# Patient Record
Sex: Male | Born: 1955 | Race: White | Hispanic: No | Marital: Single | Smoking: Former smoker
Health system: Southern US, Community
[De-identification: ages and names within clinical notes are randomized; demographics above are authoritative.]

## PROBLEM LIST (undated history)

## (undated) DIAGNOSIS — F329 Major depressive disorder, single episode, unspecified: Secondary | ICD-10-CM

## (undated) DIAGNOSIS — F419 Anxiety disorder, unspecified: Secondary | ICD-10-CM

## (undated) DIAGNOSIS — I10 Essential (primary) hypertension: Secondary | ICD-10-CM

## (undated) DIAGNOSIS — H9190 Unspecified hearing loss, unspecified ear: Secondary | ICD-10-CM

## (undated) DIAGNOSIS — M1990 Unspecified osteoarthritis, unspecified site: Secondary | ICD-10-CM

## (undated) DIAGNOSIS — K219 Gastro-esophageal reflux disease without esophagitis: Secondary | ICD-10-CM

## (undated) DIAGNOSIS — I499 Cardiac arrhythmia, unspecified: Secondary | ICD-10-CM

## (undated) DIAGNOSIS — F32A Depression, unspecified: Secondary | ICD-10-CM

## (undated) HISTORY — PX: RETINAL DETACHMENT SURGERY: SHX105

## (undated) HISTORY — PX: BACK SURGERY: SHX140

## (undated) HISTORY — PX: COLONOSCOPY: SHX174

## (undated) HISTORY — PX: EYE SURGERY: SHX253

---

## 2014-07-29 HISTORY — PX: HERNIA REPAIR: SHX51
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
## 2015-02-03 ENCOUNTER — Encounter (HOSPITAL_COMMUNITY)
Admission: RE | Admit: 2015-02-03 | Discharge: 2015-02-03 | Disposition: A | Discharge status: Home / Self Care | Payer: Medicare Other | Source: Ambulatory Visit | Attending: Orthopedic Surgery | Admitting: Orthopedic Surgery

## 2015-02-03 ENCOUNTER — Encounter (HOSPITAL_COMMUNITY)
Admission: RE | Admit: 2015-02-03 | Discharge: 2015-02-03 | Disposition: A | Discharge status: Home / Self Care | Payer: Medicare Other | Source: Ambulatory Visit | Attending: Orthopaedic Surgery | Admitting: Orthopaedic Surgery

## 2015-02-03 ENCOUNTER — Encounter (HOSPITAL_COMMUNITY): Payer: Self-pay

## 2015-02-03 DIAGNOSIS — I10 Essential (primary) hypertension: Secondary | ICD-10-CM | POA: Diagnosis not present

## 2015-02-03 DIAGNOSIS — Z87891 Personal history of nicotine dependence: Secondary | ICD-10-CM | POA: Insufficient documentation

## 2015-02-03 DIAGNOSIS — Z0183 Encounter for blood typing: Secondary | ICD-10-CM | POA: Diagnosis not present

## 2015-02-03 DIAGNOSIS — M879 Osteonecrosis, unspecified: Secondary | ICD-10-CM | POA: Diagnosis not present

## 2015-02-03 DIAGNOSIS — Z01818 Encounter for other preprocedural examination: Secondary | ICD-10-CM | POA: Insufficient documentation

## 2015-02-03 DIAGNOSIS — Z01812 Encounter for preprocedural laboratory examination: Secondary | ICD-10-CM | POA: Insufficient documentation

## 2015-02-03 HISTORY — DX: Essential (primary) hypertension: I10

## 2015-02-03 HISTORY — DX: Depression, unspecified: F32.A

## 2015-02-03 HISTORY — DX: Unspecified osteoarthritis, unspecified site: M19.90

## 2015-02-03 HISTORY — DX: Anxiety disorder, unspecified: F41.9

## 2015-02-03 HISTORY — DX: Cardiac arrhythmia, unspecified: I49.9

## 2015-02-03 HISTORY — DX: Unspecified hearing loss, unspecified ear: H91.90

## 2015-02-03 HISTORY — DX: Major depressive disorder, single episode, unspecified: F32.9

## 2015-02-03 HISTORY — DX: Gastro-esophageal reflux disease without esophagitis: K21.9

## 2015-02-03 LAB — CBC WITH DIFFERENTIAL/PLATELET
Basophils Absolute: 0 10*3/uL (ref 0.0–0.1)
Basophils Relative: 0 % (ref 0–1)
Eosinophils Absolute: 0.1 10*3/uL (ref 0.0–0.7)
Eosinophils Relative: 1 % (ref 0–5)
HCT: 39.4 % (ref 39.0–52.0)
HEMOGLOBIN: 13.4 g/dL (ref 13.0–17.0)
LYMPHS ABS: 2.3 10*3/uL (ref 0.7–4.0)
Lymphocytes Relative: 26 % (ref 12–46)
MCH: 30 pg (ref 26.0–34.0)
MCHC: 34 g/dL (ref 30.0–36.0)
MCV: 88.1 fL (ref 78.0–100.0)
Monocytes Absolute: 0.8 10*3/uL (ref 0.1–1.0)
Monocytes Relative: 10 % (ref 3–12)
NEUTROS ABS: 5.5 10*3/uL (ref 1.7–7.7)
NEUTROS PCT: 63 % (ref 43–77)
Platelets: 270 10*3/uL (ref 150–400)
RBC: 4.47 MIL/uL (ref 4.22–5.81)
RDW: 14.9 % (ref 11.5–15.5)
WBC: 8.7 10*3/uL (ref 4.0–10.5)

## 2015-02-03 LAB — COMPREHENSIVE METABOLIC PANEL
ALK PHOS: 52 U/L (ref 38–126)
ALT: 34 U/L (ref 17–63)
AST: 23 U/L (ref 15–41)
Albumin: 4.2 g/dL (ref 3.5–5.0)
Anion gap: 8 (ref 5–15)
BILIRUBIN TOTAL: 0.6 mg/dL (ref 0.3–1.2)
BUN: 13 mg/dL (ref 6–20)
CHLORIDE: 101 mmol/L (ref 101–111)
CO2: 30 mmol/L (ref 22–32)
Calcium: 9.6 mg/dL (ref 8.9–10.3)
Creatinine, Ser: 0.86 mg/dL (ref 0.61–1.24)
GFR calc Af Amer: >60 mL/min (ref >60)
Glucose, Bld: 103 mg/dL — ABNORMAL HIGH (ref 65–99)
Potassium: 4.2 mmol/L (ref 3.5–5.1)
Sodium: 139 mmol/L (ref 135–145)
Total Protein: 7.7 g/dL (ref 6.5–8.1)

## 2015-02-03 LAB — PROTIME-INR
INR: 0.94 (ref 0.00–1.49)
PROTHROMBIN TIME: 12.8 s (ref 11.6–15.2)

## 2015-02-03 LAB — TYPE AND SCREEN
ABO/RH(D): B POS
Antibody Screen: NEGATIVE

## 2015-02-03 LAB — APTT: APTT: 26 s (ref 24–37)

## 2015-02-03 LAB — SURGICAL PCR SCREEN
MRSA, PCR: NEGATIVE
Staphylococcus aureus: NEGATIVE

## 2015-02-03 LAB — ABO/RH: ABO/RH(D): B POS

## 2015-02-03 NOTE — Pre-Procedure Instructions (Addendum)
    Darryl Ray  02/03/2015      Your procedure is scheduled on Tuesday, July 19.  [facility] Admitting at 5:30 A.M.   Call this number if you have problems the morning of surgery: [phone]              For any other questions, please call [phone], Monday - Friday 8 AM - 4 PM.   Remember:  Do not eat food or drink liquids after midnight Monday, July 18.  Take these medicines the morning of surgery with A SIP OF WATER : citalopram (CELEXA), omeprazole (PRILOSEC).               Take if needed: HYDROcodone-acetaminophen (NORCO).             Stop taking Vitamins, do not take any Aspirin products, Aleve (Naproxen, Advil (Ibuprofen) after Monday, July 11.   Do not wear jewelry, make-up or nail polish.  Do not wear lotions, powders, or perfumes.    Men may shave face and neck.  Do not bring valuables to the hospital.  [facility] is not responsible for any belongings or valuables.  Contacts, dentures or bridgework may not be worn into surgery.  Leave your suitcase in the car.  After surgery it may be brought to your room.  For patients admitted to the hospital, discharge time will be determined by your treatment team.  Special instructions: Review  [facility] - Preparing For Surgery.  Please read over the following fact sheets that you were given. Pain Booklet, Coughing and Deep Breathing, Blood Transfusion Information and Surgical Site Infection Prevention

## 2015-02-05 LAB — URINE CULTURE: Culture: 5000

## 2015-02-13 MED ORDER — ACETAMINOPHEN 10 MG/ML IV SOLN
1000.0000 mg | INTRAVENOUS | Status: AC
  Administered 2015-02-14: 1000 mg via INTRAVENOUS
  Filled 2015-02-13: qty 100

## 2015-02-13 MED ORDER — SODIUM CHLORIDE 0.9 % IV SOLN: 75.0000 mL/h | INTRAVENOUS | Status: DC

## 2015-02-14 ENCOUNTER — Encounter (HOSPITAL_COMMUNITY): Payer: Self-pay | Admitting: *Deleted

## 2015-02-14 ENCOUNTER — Encounter (HOSPITAL_COMMUNITY)
Admission: RE | Disposition: A | Discharge status: Skilled Nursing Facility | Payer: Self-pay | Source: Ambulatory Visit | Attending: Orthopaedic Surgery

## 2015-02-14 ENCOUNTER — Inpatient Hospital Stay (HOSPITAL_COMMUNITY)
Admission: RE | Admit: 2015-02-14 | Discharge: 2015-02-16 | DRG: 470 | Disposition: A | Discharge status: Skilled Nursing Facility | Payer: Medicare Other | Source: Ambulatory Visit | Attending: Orthopaedic Surgery | Admitting: Orthopaedic Surgery

## 2015-02-14 ENCOUNTER — Inpatient Hospital Stay (HOSPITAL_COMMUNITY): Payer: Medicare Other | Admitting: Anesthesiology

## 2015-02-14 ENCOUNTER — Inpatient Hospital Stay (HOSPITAL_COMMUNITY): Payer: Medicare Other

## 2015-02-14 DIAGNOSIS — M87051 Idiopathic aseptic necrosis of right femur: Secondary | ICD-10-CM | POA: Diagnosis present

## 2015-02-14 DIAGNOSIS — M109 Gout, unspecified: Secondary | ICD-10-CM | POA: Diagnosis present

## 2015-02-14 DIAGNOSIS — Z9889 Other specified postprocedural states: Secondary | ICD-10-CM

## 2015-02-14 DIAGNOSIS — Z981 Arthrodesis status: Secondary | ICD-10-CM | POA: Diagnosis not present

## 2015-02-14 DIAGNOSIS — D62 Acute posthemorrhagic anemia: Secondary | ICD-10-CM | POA: Diagnosis not present

## 2015-02-14 DIAGNOSIS — Z880 Allergy status to penicillin: Secondary | ICD-10-CM | POA: Diagnosis not present

## 2015-02-14 DIAGNOSIS — M87052 Idiopathic aseptic necrosis of left femur: Secondary | ICD-10-CM | POA: Diagnosis present

## 2015-02-14 DIAGNOSIS — H9190 Unspecified hearing loss, unspecified ear: Secondary | ICD-10-CM | POA: Diagnosis present

## 2015-02-14 DIAGNOSIS — M459 Ankylosing spondylitis of unspecified sites in spine: Secondary | ICD-10-CM | POA: Diagnosis present

## 2015-02-14 DIAGNOSIS — Z79891 Long term (current) use of opiate analgesic: Secondary | ICD-10-CM | POA: Diagnosis not present

## 2015-02-14 DIAGNOSIS — F329 Major depressive disorder, single episode, unspecified: Secondary | ICD-10-CM | POA: Diagnosis present

## 2015-02-14 DIAGNOSIS — K219 Gastro-esophageal reflux disease without esophagitis: Secondary | ICD-10-CM | POA: Diagnosis present

## 2015-02-14 DIAGNOSIS — M87852 Other osteonecrosis, left femur: Secondary | ICD-10-CM | POA: Diagnosis present

## 2015-02-14 DIAGNOSIS — E871 Hypo-osmolality and hyponatremia: Secondary | ICD-10-CM | POA: Diagnosis not present

## 2015-02-14 DIAGNOSIS — Z79899 Other long term (current) drug therapy: Secondary | ICD-10-CM

## 2015-02-14 DIAGNOSIS — M1611 Unilateral primary osteoarthritis, right hip: Secondary | ICD-10-CM | POA: Diagnosis present

## 2015-02-14 DIAGNOSIS — Z87891 Personal history of nicotine dependence: Secondary | ICD-10-CM

## 2015-02-14 DIAGNOSIS — F419 Anxiety disorder, unspecified: Secondary | ICD-10-CM | POA: Diagnosis present

## 2015-02-14 DIAGNOSIS — I10 Essential (primary) hypertension: Secondary | ICD-10-CM | POA: Diagnosis present

## 2015-02-14 DIAGNOSIS — M87851 Other osteonecrosis, right femur: Principal | ICD-10-CM | POA: Diagnosis present

## 2015-02-14 DIAGNOSIS — Z96649 Presence of unspecified artificial hip joint: Secondary | ICD-10-CM

## 2015-02-14 HISTORY — PX: TOTAL HIP ARTHROPLASTY: SHX124

## 2015-02-14 SURGERY — ARTHROPLASTY, HIP, TOTAL,POSTERIOR APPROACH
Anesthesia: General | Site: Hip | Laterality: Right

## 2015-02-14 MED ORDER — ROCURONIUM BROMIDE 100 MG/10ML IV SOLN
INTRAVENOUS | Status: DC | PRN
  Administered 2015-02-14: 50 mg via INTRAVENOUS

## 2015-02-14 MED ORDER — LIDOCAINE HCL (CARDIAC) 20 MG/ML IV SOLN
INTRAVENOUS | Status: DC | PRN
  Administered 2015-02-14: 20 mg via INTRAVENOUS

## 2015-02-14 MED ORDER — FERROUS SULFATE 325 (65 FE) MG PO TABS
325.0000 mg | ORAL_TABLET | Freq: Every day | ORAL | Status: DC
  Administered 2015-02-15: 325 mg via ORAL
  Filled 2015-02-14: qty 1

## 2015-02-14 MED ORDER — MIDAZOLAM HCL 2 MG/2ML IJ SOLN: 0.5000 mg | Freq: Once | INTRAMUSCULAR | Status: DC | PRN

## 2015-02-14 MED ORDER — PHENYLEPHRINE HCL 10 MG/ML IJ SOLN
INTRAMUSCULAR | Status: DC | PRN
  Administered 2015-02-14 (×3): 80 ug via INTRAVENOUS

## 2015-02-14 MED ORDER — QUINAPRIL HCL 10 MG PO TABS
40.0000 mg | ORAL_TABLET | Freq: Every morning | ORAL | Status: DC
  Administered 2015-02-15 – 2015-02-16 (×2): 40 mg via ORAL
  Filled 2015-02-14 (×2): qty 4

## 2015-02-14 MED ORDER — METOCLOPRAMIDE HCL 5 MG PO TABS: 5.0000 mg | ORAL_TABLET | Freq: Three times a day (TID) | ORAL | Status: DC | PRN

## 2015-02-14 MED ORDER — ONDANSETRON HCL 4 MG/2ML IJ SOLN
INTRAMUSCULAR | Status: DC | PRN
  Administered 2015-02-14: 4 mg via INTRAVENOUS

## 2015-02-14 MED ORDER — KETOROLAC TROMETHAMINE 15 MG/ML IJ SOLN
INTRAMUSCULAR | Status: AC
  Filled 2015-02-14: qty 1

## 2015-02-14 MED ORDER — SODIUM CHLORIDE 0.9 % IR SOLN
Status: DC | PRN
  Administered 2015-02-14: 1000 mL

## 2015-02-14 MED ORDER — FENTANYL CITRATE (PF) 250 MCG/5ML IJ SOLN
INTRAMUSCULAR | Status: AC
  Filled 2015-02-14: qty 5

## 2015-02-14 MED ORDER — HYDROMORPHONE HCL 1 MG/ML IJ SOLN
0.2500 mg | INTRAMUSCULAR | Status: DC | PRN
  Administered 2015-02-14 (×4): 0.5 mg via INTRAVENOUS

## 2015-02-14 MED ORDER — CHLORHEXIDINE GLUCONATE 4 % EX LIQD: 60.0000 mL | Freq: Once | CUTANEOUS | Status: DC

## 2015-02-14 MED ORDER — ARTIFICIAL TEARS OP OINT
TOPICAL_OINTMENT | OPHTHALMIC | Status: AC
  Filled 2015-02-14: qty 3.5

## 2015-02-14 MED ORDER — CITALOPRAM HYDROBROMIDE 40 MG PO TABS
40.0000 mg | ORAL_TABLET | Freq: Every day | ORAL | Status: DC
  Administered 2015-02-15 – 2015-02-16 (×2): 40 mg via ORAL
  Filled 2015-02-14 (×2): qty 1

## 2015-02-14 MED ORDER — ALUM & MAG HYDROXIDE-SIMETH 200-200-20 MG/5ML PO SUSP: 30.0000 mL | ORAL | Status: DC | PRN

## 2015-02-14 MED ORDER — RIVAROXABAN 10 MG PO TABS
10.0000 mg | ORAL_TABLET | Freq: Every day | ORAL | Status: DC
  Administered 2015-02-15 – 2015-02-16 (×2): 10 mg via ORAL
  Filled 2015-02-14 (×2): qty 1

## 2015-02-14 MED ORDER — BISACODYL 10 MG RE SUPP
10.0000 mg | Freq: Every day | RECTAL | Status: DC | PRN
  Administered 2015-02-15: 10 mg via RECTAL
  Filled 2015-02-14: qty 1

## 2015-02-14 MED ORDER — FENTANYL CITRATE (PF) 100 MCG/2ML IJ SOLN
INTRAMUSCULAR | Status: DC | PRN
  Administered 2015-02-14: 25 ug via INTRAVENOUS
  Administered 2015-02-14: 50 ug via INTRAVENOUS
  Administered 2015-02-14: 250 ug via INTRAVENOUS
  Administered 2015-02-14 (×2): 50 ug via INTRAVENOUS
  Administered 2015-02-14: 25 ug via INTRAVENOUS
  Administered 2015-02-14: 50 ug via INTRAVENOUS

## 2015-02-14 MED ORDER — BUPIVACAINE-EPINEPHRINE (PF) 0.25% -1:200000 IJ SOLN
INTRAMUSCULAR | Status: DC | PRN
  Administered 2015-02-14: 30 mL via PERINEURAL

## 2015-02-14 MED ORDER — KETOROLAC TROMETHAMINE 15 MG/ML IJ SOLN
INTRAMUSCULAR | Status: AC
  Administered 2015-02-14: 15 mg
  Filled 2015-02-14: qty 1

## 2015-02-14 MED ORDER — ROCURONIUM BROMIDE 50 MG/5ML IV SOLN
INTRAVENOUS | Status: AC
  Filled 2015-02-14: qty 1

## 2015-02-14 MED ORDER — KETOROLAC TROMETHAMINE 15 MG/ML IJ SOLN
15.0000 mg | Freq: Four times a day (QID) | INTRAMUSCULAR | Status: AC
  Administered 2015-02-14 – 2015-02-15 (×4): 15 mg via INTRAVENOUS
  Filled 2015-02-14 (×4): qty 1

## 2015-02-14 MED ORDER — HYDROMORPHONE HCL 1 MG/ML IJ SOLN
INTRAMUSCULAR | Status: AC
  Filled 2015-02-14: qty 1

## 2015-02-14 MED ORDER — VANCOMYCIN HCL IN DEXTROSE 1-5 GM/200ML-% IV SOLN: 1000.0000 mg | Freq: Two times a day (BID) | INTRAVENOUS | Status: DC

## 2015-02-14 MED ORDER — MENTHOL 3 MG MT LOZG: 1.0000 | LOZENGE | OROMUCOSAL | Status: DC | PRN

## 2015-02-14 MED ORDER — VANCOMYCIN HCL IN DEXTROSE 1-5 GM/200ML-% IV SOLN
INTRAVENOUS | Status: AC
  Administered 2015-02-14: 1000 mg via INTRAVENOUS
  Filled 2015-02-14: qty 200

## 2015-02-14 MED ORDER — PHENYLEPHRINE 40 MCG/ML (10ML) SYRINGE FOR IV PUSH (FOR BLOOD PRESSURE SUPPORT)
PREFILLED_SYRINGE | INTRAVENOUS | Status: AC
  Filled 2015-02-14: qty 10

## 2015-02-14 MED ORDER — METOCLOPRAMIDE HCL 5 MG/ML IJ SOLN: 5.0000 mg | Freq: Three times a day (TID) | INTRAMUSCULAR | Status: DC | PRN

## 2015-02-14 MED ORDER — SODIUM CHLORIDE 0.9 % IV SOLN
75.0000 mL/h | INTRAVENOUS | Status: DC
  Administered 2015-02-14 – 2015-02-15 (×2): 75 mL/h via INTRAVENOUS

## 2015-02-14 MED ORDER — MAGNESIUM CITRATE PO SOLN: 1.0000 | Freq: Once | ORAL | Status: AC | PRN

## 2015-02-14 MED ORDER — MEPERIDINE HCL 25 MG/ML IJ SOLN: 6.2500 mg | INTRAMUSCULAR | Status: DC | PRN

## 2015-02-14 MED ORDER — BACLOFEN 10 MG PO TABS: 10.0000 mg | ORAL_TABLET | Freq: Two times a day (BID) | ORAL | Status: DC | PRN

## 2015-02-14 MED ORDER — ALLOPURINOL 300 MG PO TABS
300.0000 mg | ORAL_TABLET | Freq: Every day | ORAL | Status: DC
  Administered 2015-02-15 – 2015-02-16 (×2): 300 mg via ORAL
  Filled 2015-02-14 (×2): qty 1

## 2015-02-14 MED ORDER — MIDAZOLAM HCL 5 MG/5ML IJ SOLN
INTRAMUSCULAR | Status: DC | PRN
  Administered 2015-02-14: 2 mg via INTRAVENOUS

## 2015-02-14 MED ORDER — LIDOCAINE HCL (CARDIAC) 20 MG/ML IV SOLN
INTRAVENOUS | Status: AC
  Filled 2015-02-14: qty 10

## 2015-02-14 MED ORDER — DEXAMETHASONE SODIUM PHOSPHATE 4 MG/ML IJ SOLN
INTRAMUSCULAR | Status: DC | PRN
  Administered 2015-02-14: 4 mg via INTRAVENOUS

## 2015-02-14 MED ORDER — HYDROMORPHONE HCL 1 MG/ML IJ SOLN
0.5000 mg | INTRAMUSCULAR | Status: DC | PRN
  Administered 2015-02-14: 1 mg via INTRAVENOUS
  Filled 2015-02-14: qty 1

## 2015-02-14 MED ORDER — DOCUSATE SODIUM 100 MG PO CAPS
100.0000 mg | ORAL_CAPSULE | Freq: Two times a day (BID) | ORAL | Status: DC
  Administered 2015-02-14 – 2015-02-16 (×4): 100 mg via ORAL
  Filled 2015-02-14 (×4): qty 1

## 2015-02-14 MED ORDER — LACTATED RINGERS IV SOLN
INTRAVENOUS | Status: DC | PRN
  Administered 2015-02-14 (×2): via INTRAVENOUS

## 2015-02-14 MED ORDER — ONDANSETRON HCL 4 MG PO TABS: 4.0000 mg | ORAL_TABLET | Freq: Four times a day (QID) | ORAL | Status: DC | PRN

## 2015-02-14 MED ORDER — PHENOL 1.4 % MT LIQD: 1.0000 | OROMUCOSAL | Status: DC | PRN

## 2015-02-14 MED ORDER — HYDROCHLOROTHIAZIDE 25 MG PO TABS
25.0000 mg | ORAL_TABLET | Freq: Every day | ORAL | Status: DC
  Administered 2015-02-15 – 2015-02-16 (×2): 25 mg via ORAL
  Filled 2015-02-14 (×2): qty 1

## 2015-02-14 MED ORDER — PHENYLEPHRINE HCL 10 MG/ML IJ SOLN
10.0000 mg | INTRAVENOUS | Status: DC | PRN
  Administered 2015-02-14: 15 ug/min via INTRAVENOUS

## 2015-02-14 MED ORDER — OXYCODONE HCL 5 MG PO TABS
5.0000 mg | ORAL_TABLET | ORAL | Status: DC | PRN
  Administered 2015-02-14: 5 mg via ORAL
  Administered 2015-02-14 – 2015-02-16 (×11): 10 mg via ORAL
  Filled 2015-02-14 (×12): qty 2

## 2015-02-14 MED ORDER — OXYCODONE HCL 5 MG PO TABS
ORAL_TABLET | ORAL | Status: AC
  Filled 2015-02-14: qty 2

## 2015-02-14 MED ORDER — ONDANSETRON HCL 4 MG/2ML IJ SOLN: 4.0000 mg | Freq: Four times a day (QID) | INTRAMUSCULAR | Status: DC | PRN

## 2015-02-14 MED ORDER — NEOSTIGMINE METHYLSULFATE 10 MG/10ML IV SOLN
INTRAVENOUS | Status: DC | PRN
  Administered 2015-02-14: 2.5 mg via INTRAVENOUS

## 2015-02-14 MED ORDER — MIDAZOLAM HCL 2 MG/2ML IJ SOLN
INTRAMUSCULAR | Status: AC
  Filled 2015-02-14: qty 2

## 2015-02-14 MED ORDER — PROPOFOL 10 MG/ML IV BOLUS
INTRAVENOUS | Status: DC | PRN
  Administered 2015-02-14: 120 mg via INTRAVENOUS

## 2015-02-14 MED ORDER — EPHEDRINE SULFATE 50 MG/ML IJ SOLN
INTRAMUSCULAR | Status: DC | PRN
  Administered 2015-02-14: 5 mg via INTRAVENOUS

## 2015-02-14 MED ORDER — PROMETHAZINE HCL 25 MG/ML IJ SOLN: 6.2500 mg | INTRAMUSCULAR | Status: DC | PRN

## 2015-02-14 MED ORDER — ACETAMINOPHEN 10 MG/ML IV SOLN
1000.0000 mg | Freq: Four times a day (QID) | INTRAVENOUS | Status: AC
  Administered 2015-02-14 – 2015-02-15 (×4): 1000 mg via INTRAVENOUS
  Filled 2015-02-14 (×4): qty 100

## 2015-02-14 MED ORDER — PRAVASTATIN SODIUM 40 MG PO TABS
80.0000 mg | ORAL_TABLET | Freq: Every day | ORAL | Status: DC
  Administered 2015-02-14 – 2015-02-15 (×2): 80 mg via ORAL
  Filled 2015-02-14 (×2): qty 2

## 2015-02-14 MED ORDER — DIPHENHYDRAMINE HCL 12.5 MG/5ML PO ELIX
12.5000 mg | ORAL_SOLUTION | ORAL | Status: DC | PRN
  Administered 2015-02-15: 25 mg via ORAL
  Filled 2015-02-14: qty 10

## 2015-02-14 MED ORDER — BUPIVACAINE-EPINEPHRINE (PF) 0.25% -1:200000 IJ SOLN
INTRAMUSCULAR | Status: AC
  Filled 2015-02-14: qty 30

## 2015-02-14 MED ORDER — GLYCOPYRROLATE 0.2 MG/ML IJ SOLN
INTRAMUSCULAR | Status: DC | PRN
  Administered 2015-02-14: 0.3 mg via INTRAVENOUS

## 2015-02-14 MED ORDER — POLYETHYLENE GLYCOL 3350 17 G PO PACK: 17.0000 g | PACK | Freq: Every day | ORAL | Status: DC | PRN

## 2015-02-14 MED ORDER — KETOROLAC TROMETHAMINE 30 MG/ML IJ SOLN
INTRAMUSCULAR | Status: AC
  Filled 2015-02-14: qty 1

## 2015-02-14 MED ORDER — DEXAMETHASONE SODIUM PHOSPHATE 4 MG/ML IJ SOLN
INTRAMUSCULAR | Status: AC
  Filled 2015-02-14: qty 1

## 2015-02-14 MED ORDER — PANTOPRAZOLE SODIUM 40 MG PO TBEC
80.0000 mg | DELAYED_RELEASE_TABLET | Freq: Every day | ORAL | Status: DC
  Administered 2015-02-15 – 2015-02-16 (×2): 80 mg via ORAL
  Filled 2015-02-14 (×2): qty 2

## 2015-02-14 SURGICAL SUPPLY — 56 items
BLADE SAW SAG 73X25 THK (BLADE) ×2
BLADE SAW SGTL 73X25 THK (BLADE) ×1 IMPLANT
BRUSH FEMORAL CANAL (MISCELLANEOUS) IMPLANT
CAPT HIP TOTAL 2 ×3 IMPLANT
COVER SURGICAL LIGHT HANDLE (MISCELLANEOUS) ×3 IMPLANT
DRAPE INCISE IOBAN 66X45 STRL (DRAPES) IMPLANT
DRAPE ORTHO SPLIT 77X108 STRL (DRAPES) ×4
DRAPE SURG ORHT 6 SPLT 77X108 (DRAPES) ×2 IMPLANT
DRSG MEPILEX BORDER 4X12 (GAUZE/BANDAGES/DRESSINGS) IMPLANT
DRSG MEPILEX BORDER 4X8 (GAUZE/BANDAGES/DRESSINGS) ×3 IMPLANT
DURAPREP 26ML APPLICATOR (WOUND CARE) ×6 IMPLANT
ELECT BLADE 6.5 EXT (BLADE) IMPLANT
ELECT CAUTERY BLADE 6.4 (BLADE) ×3 IMPLANT
ELECT REM PT RETURN 9FT ADLT (ELECTROSURGICAL) ×3
ELECTRODE REM PT RTRN 9FT ADLT (ELECTROSURGICAL) ×1 IMPLANT
EVACUATOR 1/8 PVC DRAIN (DRAIN) IMPLANT
FACESHIELD WRAPAROUND (MASK) ×9 IMPLANT
GLOVE BIOGEL PI IND STRL 8 (GLOVE) ×1 IMPLANT
GLOVE BIOGEL PI IND STRL 8.5 (GLOVE) ×1 IMPLANT
GLOVE BIOGEL PI INDICATOR 8 (GLOVE) ×2
GLOVE BIOGEL PI INDICATOR 8.5 (GLOVE) ×2
GLOVE ECLIPSE 8.0 STRL XLNG CF (GLOVE) ×12 IMPLANT
GLOVE SURG ORTHO 8.5 STRL (GLOVE) ×6 IMPLANT
GOWN STRL REUS W/ TWL LRG LVL3 (GOWN DISPOSABLE) ×2 IMPLANT
GOWN STRL REUS W/TWL 2XL LVL3 (GOWN DISPOSABLE) ×6 IMPLANT
GOWN STRL REUS W/TWL LRG LVL3 (GOWN DISPOSABLE) ×4
HANDPIECE INTERPULSE COAX TIP (DISPOSABLE)
IMMOBILIZER KNEE 20 (SOFTGOODS) IMPLANT
IMMOBILIZER KNEE 22 UNIV (SOFTGOODS) ×3 IMPLANT
KIT BASIN OR (CUSTOM PROCEDURE TRAY) ×3 IMPLANT
KIT ROOM TURNOVER OR (KITS) ×3 IMPLANT
MANIFOLD NEPTUNE II (INSTRUMENTS) ×3 IMPLANT
NEEDLE 22X1 1/2 (OR ONLY) (NEEDLE) ×3 IMPLANT
NS IRRIG 1000ML POUR BTL (IV SOLUTION) ×3 IMPLANT
PACK TOTAL JOINT (CUSTOM PROCEDURE TRAY) ×3 IMPLANT
PACK UNIVERSAL I (CUSTOM PROCEDURE TRAY) ×3 IMPLANT
PAD ARMBOARD 7.5X6 YLW CONV (MISCELLANEOUS) ×9 IMPLANT
PRESSURIZER FEMORAL UNIV (MISCELLANEOUS) IMPLANT
SET HNDPC FAN SPRY TIP SCT (DISPOSABLE) IMPLANT
STAPLER VISISTAT 35W (STAPLE) ×3 IMPLANT
SUCTION FRAZIER TIP 10 FR DISP (SUCTIONS) ×3 IMPLANT
SUT BONE WAX W31G (SUTURE) IMPLANT
SUT ETHIBOND NAB CT1 #1 30IN (SUTURE) ×9 IMPLANT
SUT MNCRL AB 3-0 PS2 18 (SUTURE) ×3 IMPLANT
SUT VIC AB 0 CT1 27 (SUTURE) ×4
SUT VIC AB 0 CT1 27XBRD ANBCTR (SUTURE) ×2 IMPLANT
SUT VIC AB 1 CT1 27 (SUTURE) ×2
SUT VIC AB 1 CT1 27XBRD ANBCTR (SUTURE) ×1 IMPLANT
SUT VIC AB 2-0 CT1 27 (SUTURE)
SUT VIC AB 2-0 CT1 TAPERPNT 27 (SUTURE) IMPLANT
SYR CONTROL 10ML LL (SYRINGE) ×3 IMPLANT
TOWEL OR 17X24 6PK STRL BLUE (TOWEL DISPOSABLE) ×6 IMPLANT
TOWEL OR 17X26 10 PK STRL BLUE (TOWEL DISPOSABLE) ×3 IMPLANT
TOWER CARTRIDGE SMART MIX (DISPOSABLE) IMPLANT
WATER STERILE IRR 1000ML POUR (IV SOLUTION) IMPLANT
WRAP KNEE MAXI GEL POST OP (GAUZE/BANDAGES/DRESSINGS) ×3 IMPLANT

## 2015-02-14 NOTE — Anesthesia Procedure Notes (Signed)
Procedure Name: Intubation Date/Time: 02/14/2015 7:24 AM Performed by: Wray KearnsFOLEY, Elster Corbello A Pre-anesthesia Checklist: Patient identified, Timeout performed, Emergency Drugs available, Suction available and Patient being monitored Patient Re-evaluated:Patient Re-evaluated prior to inductionOxygen Delivery Method: Circle system utilized Preoxygenation: Pre-oxygenation with 100% oxygen Intubation Type: IV induction and Cricoid Pressure applied Ventilation: Mask ventilation without difficulty and Oral airway inserted - appropriate to patient size Laryngoscope Size: Mac and 4 Grade View: Grade I Tube type: Oral Tube size: 8.0 mm Number of attempts: 1 Airway Equipment and Method: Stylet Placement Confirmation: ETT inserted through vocal cords under direct vision,  breath sounds checked- equal and bilateral and positive ETCO2 Secured at: 23 cm Tube secured with: Tape Dental Injury: Teeth and Oropharynx as per pre-operative assessment

## 2015-02-14 NOTE — Care Management Note (Signed)
Case Management Note  Patient Details  Name: Darryl Ray MRN: [id number] Date of Birth: 10/05/1955  Subjective/Objective:  59 yr old male s/p right total hip arthroplasty            Action/Plan: Patient was preoperatively [facility] with [facility]. Case manager will follow. tup     Expected Discharge Date:                  Expected Discharge Plan:  Home w Home Health Services  In-House Referral:  NA  Discharge planning Services  CM Consult  Post Acute Care Choice:  Durable Medical Equipment Choice offered to:  Patient  DME Arranged:    DME Agency:     HH Arranged:  PT HH Agency:  [facility]  Status of Service:  In process, will continue to follow  Medicare Important Message Given:    Date Medicare IM Given:    Medicare IM give by:    Date Additional Medicare IM Given:    Additional Medicare Important Message give by:     If discussed at Long Length of Stay Meetings, dates discussed:    Additional Comments:  Durenda GuthrieBrady, Adis Sturgill Naomi, RN 02/14/2015, 3:30 PM

## 2015-02-14 NOTE — Progress Notes (Signed)
Orthopedic Tech Progress Note Patient Details:  Darryl Ray 04/13/1956 [id number]  Ortho Devices Ortho Device/Splint Location: applied ovehread frame Ortho Device/Splint Interventions: Ordered, Application   [name], Bannon Giammarco Craig 02/14/2015, 4:19 PM

## 2015-02-14 NOTE — Transfer of Care (Signed)
Immediate Anesthesia Transfer of Care Note  Patient: Darryl Ray  Procedure(s) Performed: Procedure(s): TOTAL HIP ARTHROPLASTY (Right)  Patient Location: PACU  Anesthesia Type:General  Level of Consciousness: awake, alert , oriented, patient cooperative and responds to stimulation  Airway & Oxygen Therapy: Patient Spontanous Breathing and Patient connected to nasal cannula oxygen  Post-op Assessment: Report given to RN, Post -op Vital signs reviewed and stable, Patient moving all extremities and Patient moving all extremities X 4  Post vital signs: Reviewed and stable  Last Vitals:  Filed Vitals:   02/14/15 0558  BP: 146/76  Pulse: 85  Temp: 36.7 C  Resp: 18    Complications: No apparent anesthesia complications

## 2015-02-14 NOTE — Op Note (Signed)
Darryl Ray, Darryl Ray NO.:  [id number]  MEDICAL RECORD NO.:  [id number]  LOCATION:  [facility]                         FACILITY:  [facility]  PHYSICIAN:  Vonna Kotyk. Morio Widen, M.D.DATE OF BIRTH:  1956/06/25  DATE OF PROCEDURE:  02/14/2015 DATE OF DISCHARGE:                              OPERATIVE REPORT   PREOPERATIVE DIAGNOSIS:  Avascular necrosis, right hip with osteoarthritis.  POSTOPERATIVE DIAGNOSIS:  Avascular necrosis, right hip with osteoarthritis.  PROCEDURE:  Right total hip replacement.  SURGEON:  Vonna Kotyk. Durward Fortes, M.D.  ASSISTANT:  Aaron Edelman D. Petrarca, PA-C, who was present throughout the operative procedure to ensure its timely completion.  ANESTHESIA:  General.  COMPLICATIONS:  None.  COMPONENTS:  DePuy AML 15 mm large stature femoral component, 54 mm outer diameter Gription 3 metallic acetabular component with an apex hole eliminator, and a Marathon polyethylene liner with a 10-degree posterior lip, +4 thickness, 36-mm outer diameter hip ball with a 1.5 mm neck length.  DESCRIPTION OF PROCEDURE:  Darryl Ray was met in the holding area and identified the right hip as the appropriate operative site and marked it accordingly.  Any questions were answered.  The patient was then transported to room #7 and placed under general anesthesia without difficulty.  He was then placed in the lateral decubitus position with the right side up and secured to the operating room table with the Innomed hip system.  Time-out was called.  The right hip was then prepped from iliac crest to the ankle with chlorhexidine scrub and then DuraPrep x2.  Sterile draping was performed.  A routine Southern incision was utilized and via sharp dissection, carried down to subcutaneous tissue.  Gross bleeders were Bovie coagulated.  Adipose tissue was incised at the level of the iliotibial band.  Self-retaining retractors were inserted.  The IT band was incised, muscle fibers  bluntly separated, and retractors placed more deeply.  Short external rotators were identified, tendinous structures were tagged, they were then incised from the posterior attachment of the greater trochanter.  The capsule was identified, incised along the femoral neck and head.  The joint was entered.  There was an obvious effusion, probably 5 to 6 mL in volume and beefy red synovitis.  The head was then easily dislocated posteriorly.  Retractor was then placed about the femoral neck using the calcar guide. An osteotomy was made just beneath the femoral head and the femoral neck.  Soft tissue was removed from the piriformis fossa.  A starter hole was then made followed by reaming to 14.5 mm to accept a 15 mm component.  Rasping was performed sequentially to a 15 mm large component.  The neck cut was approximately a fingerbreadth proximal to the lesser trochanter.  Calcar reamer was used to obtain the appropriate calcar angle.  Retractors were then placed about the acetabulum, the labrum was sharply excised.  There was abundant soft tissue within the acetabulum that was sharply debrided.  Reaming was then performed sequentially to a 53 mm to accept a 54 component.  I trialed a 52 component, it would completely seat with tight rim fit, 54 would not seat.  The final Gription 3 metallic acetabular  component was then impacted in place using the external acetabular guide with very nice fit, it was nice and tight.  We then trialed the marathon trial liner followed by the 15 mm large stature femoral rasp and a 1.5 mm neck length, 36 mm outer diameter hip ball.  This was reduced and through a full range of motion, the components remained stable, we had excellent stability.  The trial components were then removed.  The joint was copiously irrigated with saline solution.  The apex hole eliminator inserted followed by the polyethylene marathon liner.  The wound was irrigated with saline  solution.  The large stature 15 mm femoral component was then impacted on the calcar.  We trialed the 1.5 mm neck length, 36 mm outer diameter hip ball, felt that it was perfectly stable with about 1 mm of toggling, but no instability with flexion, extension, internal-external rotation.  The final hip ball was then inserted, i.e., 1.5 mm neck length, 36 mm outer diameter hip ball.  This was reduced and again through a full range of motion was perfectly stable.  Wound was irrigated with saline solution.  The capsule was closed anatomically with 0-Ethibond.  Short external rotator tendons were closed with a similar material.  I did check the sciatic nerve throughout the procedure and felt that it was out of harm's way.  The IP band was then closed with a running 0-Vicryl, subcu in several layers with 2-0 Vicryl and 3-0 Monocryl.  Skin closed with skin clips. Sterile bulky dressing was applied.  The patient then placed in a supine position, knee immobilizer placed in the right lower extremity.  The patient was awoken and returned to the postanesthesia recovery room in a satisfactory condition.     Vonna Kotyk. Durward Fortes, M.D.     PWW/MEDQ  D:  02/14/2015  T:  02/14/2015  Job:  [id number]

## 2015-02-14 NOTE — Progress Notes (Signed)
Orthopedic Tech Progress Note Patient Details:  Darryl Ray 10/17/1955 [id number] Patient ID: Darryl Ray, male   DOB: 06/16/1956, 59 y.o.   MRN: [id number]   [name], [name] 02/14/2015, 3:54 PM Patient already has knee immobilizer on

## 2015-02-14 NOTE — Progress Notes (Signed)
Partial plate returned to pt per request

## 2015-02-14 NOTE — Progress Notes (Signed)
OT Cancellation Note  Patient Details Name: Darryl Ray MRN: [id number] DOB: 04/21/1956   Cancelled Treatment:    Reason Eval/Treat Not Completed: Other (comment) Pt's current D/C plan is SNF. No apparent immediate acute care OT needs, therefore will defer OT to SNF. If OT eval is needed please call Acute Rehab Dept. at [phone] or text page OT at [phone].    Earlie RavelingStraub, Diedre Maclellan L OTR/L [phone] 02/14/2015, 4:37 PM

## 2015-02-14 NOTE — Anesthesia Preprocedure Evaluation (Addendum)
Anesthesia Evaluation  Patient identified by MRN, date of birth, ID band Patient awake    Reviewed: Allergy & Precautions, NPO status , Patient's Chart, lab work & pertinent test results  History of Anesthesia Complications Negative for: history of anesthetic complications  Airway Mallampati: II  TM Distance: >3 FB Neck ROM: Full    Dental  (+) Dental Advisory Given, Partial Lower, Missing   Pulmonary COPDCurrent Smoker,  breath sounds clear to auscultation        Cardiovascular hypertension, Pt. on medications - anginaRhythm:Regular Rate:Normal     Neuro/Psych S/p lumbar fusion negative neurological ROS     GI/Hepatic Neg liver ROS, GERD-  Medicated and Controlled,  Endo/Other  negative endocrine ROS  Renal/GU negative Renal ROS     Musculoskeletal  (+) Arthritis -, Ankylosing spondylosis   Abdominal   Peds  Hematology   Anesthesia Other Findings   Reproductive/Obstetrics                            Anesthesia Physical Anesthesia Plan  ASA: II  Anesthesia Plan: General   Post-op Pain Management:    Induction: Intravenous  Airway Management Planned: Oral ETT  Additional Equipment:   Intra-op Plan:   Post-operative Plan: Extubation in OR  Informed Consent: I have reviewed the patients History and Physical, chart, labs and discussed the procedure including the risks, benefits and alternatives for the proposed anesthesia with the patient or authorized representative who has indicated his/her understanding and acceptance.   Dental advisory given  Plan Discussed with: CRNA and Surgeon  Anesthesia Plan Comments: (Plan routine monitors, GETA)        Anesthesia Quick Evaluation

## 2015-02-14 NOTE — H&P (Signed)
  The recent History & Physical has been reviewed. I have personally examined the patient today. There is no interval change to the documented History & Physical. The patient would like to proceed with the procedure.  [name], PETER W 02/14/2015,  7:11 AM

## 2015-02-14 NOTE — Anesthesia Postprocedure Evaluation (Signed)
  Anesthesia Post-op Note  Patient: Darryl Ray  Procedure(s) Performed: Procedure(s): TOTAL HIP ARTHROPLASTY (Right)  Patient Location: PACU  Anesthesia Type:General  Level of Consciousness: awake, alert , oriented and patient cooperative  Airway and Oxygen Therapy: Patient Spontanous Breathing and Patient connected to nasal cannula oxygen  Post-op Pain: mild  Post-op Assessment: Post-op Vital signs reviewed, Patient's Cardiovascular Status Stable, Respiratory Function Stable, Patent Airway, No signs of Nausea or vomiting and Pain level controlled     RLE Motor Response: Responds to commands RLE Sensation: Full sensation      Post-op Vital Signs: Reviewed and stable  Last Vitals:  Filed Vitals:   02/14/15 1030  BP: 113/57  Pulse: 92  Temp:   Resp: 16    Complications: No apparent anesthesia complications

## 2015-02-14 NOTE — Op Note (Signed)
PATIENT ID:      Darryl Ray  MRN:     [id number] DOB/AGE:    58/13/1957 / 58 y.o.       OPERATIVE REPORT    DATE OF PROCEDURE:  02/14/2015       PREOPERATIVE DIAGNOSIS:   AVASCULAR NECROSIS OF RIGHT HIP                                                       Estimated body mass index is 25.12 kg/(m^2) as calculated from the following:   Height as of this encounter: 5\' 7"  (1.702 m).   Weight as of this encounter: 72.774 kg (160 lb 7 oz).     POSTOPERATIVE DIAGNOSIS:   AVASCULAR NECROSIS OF RIGHT HIP                                                                     Estimated body mass index is 25.12 kg/(m^2) as calculated from the following:   Height as of this encounter: 5\' 7"  (1.702 m).   Weight as of this encounter: 72.774 kg (160 lb 7 oz).     PROCEDURE:  Procedure(s):RIGHT TOTAL HIP ARTHROPLASTY      SURGEON:  Norlene CampbellPeter Cahlil Sattar, MD    ASSISTANT:   Jacqualine CodeBrian Petrarca, PA-C   (Present and scrubbed throughout the case, critical for assistance with exposure, retraction, instrumentation, and closure.)          ANESTHESIA: general     DRAINS: none :      TOURNIQUET TIME: * No tourniquets in log *    COMPLICATIONS:  None   CONDITION:  stable  PROCEDURE IN DETAIL: [id number]   Darryl Ray 02/14/2015, 9:07 AM

## 2015-02-14 NOTE — Evaluation (Signed)
Physical Therapy Evaluation Patient Details Name: Darryl Ray MRN: [id number] DOB: 07/07/1956 Today's Date: 02/14/2015   History of Present Illness  pt is a 59 y/o male s/p R THA  PHHx:  ankylosising spondylitis  Clinical Impression  Pt admitted with/for R THA.  Pt currently limited functionally due to the problems listed. ( See problems list.)   Pt will benefit from PT to maximize function and safety in order to get ready for next venue listed below.     Follow Up Recommendations CIR    Equipment Recommendations  Rolling walker with 5" wheels;3in1 (PT)    Recommendations for Other Services Rehab consult     Precautions / Restrictions Precautions Precautions: Posterior Hip Required Braces or Orthoses: Knee Immobilizer - Right Knee Immobilizer - Right: Other (comment) (in bed) Restrictions Weight Bearing Restrictions: Yes RLE Weight Bearing: Weight bearing as tolerated      Mobility  Bed Mobility Overal bed mobility: Needs Assistance Bed Mobility: Supine to Sit     Supine to sit: Min assist     General bed mobility comments: bridged to EOB and symmetrically up to EOB  Transfers Overall transfer level: Needs assistance   Transfers: Sit to/from Stand Sit to Stand: Min assist         General transfer comment: cues for technique and hand placement  Ambulation/Gait Ambulation/Gait assistance: Min guard;Min assist Ambulation Distance (Feet): 50 Feet Assistive device: Rolling walker (2 wheeled) Gait Pattern/deviations: Step-to pattern Gait velocity: slow   General Gait Details: cues for sequencing  Stairs            Wheelchair Mobility    Modified Rankin (Stroke Patients Only)       Balance Overall balance assessment: No apparent balance deficits (not formally assessed)                                           Pertinent Vitals/Pain Pain Assessment: Faces Faces Pain Scale: Hurts little more Pain Location: R hip Pain  Descriptors / Indicators: Aching;Grimacing Pain Intervention(s): Monitored during session;Premedicated before session    Home Living   Living Arrangements: Alone                    Prior Function Level of Independence: Independent with assistive device(s)               Hand Dominance        Extremity/Trunk Assessment   Upper Extremity Assessment: Defer to OT evaluation           Lower Extremity Assessment: Overall WFL for tasks assessed;RLE deficits/detail      Cervical / Trunk Assessment: Kyphotic  Communication   Communication: No difficulties  Cognition Arousal/Alertness: Awake/alert Behavior During Therapy: WFL for tasks assessed/performed Overall Cognitive Status: Within Functional Limits for tasks assessed                      General Comments      Exercises Total Joint Exercises Ankle Circles/Pumps: AROM;Both;10 reps;Supine Quad Sets: AROM;Both;10 reps;Supine Gluteal Sets: AROM;10 reps;Supine Heel Slides: AAROM;Right;10 reps;Supine      Assessment/Plan    PT Assessment Patient needs continued PT services  PT Diagnosis Acute pain   PT Problem List Decreased strength;Decreased activity tolerance;Decreased mobility;Decreased knowledge of use of DME;Decreased knowledge of precautions;Pain  PT Treatment Interventions DME instruction;Gait training;Stair training;Functional mobility training;Therapeutic activities;Patient/family education  PT Goals (Current goals can be found in the Care Plan section) Acute Rehab PT Goals Patient Stated Goal: home alone after rehab PT Goal Formulation: With patient Time For Goal Achievement: 02/21/15 Potential to Achieve Goals: Good    Frequency Min 3X/week   Barriers to discharge        Co-evaluation               End of Session   Activity Tolerance: Patient tolerated treatment well Patient left: in chair;with call bell/phone within reach Nurse Communication: Mobility status          Time: 4098-1191 PT Time Calculation (min) (ACUTE ONLY): 33 min   Charges:   PT Evaluation $Initial PT Evaluation Tier I: 1 Procedure PT Treatments $Gait Training: 8-22 mins   PT G Codes:        Breck [place], Eliseo Gum 02/14/2015, 5:42 PM 02/14/2015  Hooker Bing, PT [phone]  (pager)

## 2015-02-14 NOTE — Care Management Utilization Note (Signed)
Utilization review completed. Dodd Schmid, RN Case Manager [phone]. 

## 2015-02-14 NOTE — Progress Notes (Signed)
Report given to emily rn as cargiver

## 2015-02-15 ENCOUNTER — Encounter (HOSPITAL_COMMUNITY): Payer: Self-pay | Admitting: General Practice

## 2015-02-15 LAB — CBC WITH DIFFERENTIAL/PLATELET
BASOS ABS: 0 10*3/uL (ref 0.0–0.1)
Basophils Relative: 0 % (ref 0–1)
EOS ABS: 0 10*3/uL (ref 0.0–0.7)
Eosinophils Relative: 1 % (ref 0–5)
HCT: 23.5 % — ABNORMAL LOW (ref 39.0–52.0)
Hemoglobin: 8 g/dL — ABNORMAL LOW (ref 13.0–17.0)
LYMPHS PCT: 30 % (ref 12–46)
Lymphs Abs: 1.9 10*3/uL (ref 0.7–4.0)
MCH: 30.8 pg (ref 26.0–34.0)
MCHC: 34 g/dL (ref 30.0–36.0)
MCV: 90.4 fL (ref 78.0–100.0)
Monocytes Absolute: 0.9 10*3/uL (ref 0.1–1.0)
Monocytes Relative: 15 % — ABNORMAL HIGH (ref 3–12)
Neutro Abs: 3.4 10*3/uL (ref 1.7–7.7)
Neutrophils Relative %: 54 % (ref 43–77)
Platelets: 155 10*3/uL (ref 150–400)
RBC: 2.6 MIL/uL — ABNORMAL LOW (ref 4.22–5.81)
RDW: 15.9 % — AB (ref 11.5–15.5)
WBC: 6.3 10*3/uL (ref 4.0–10.5)

## 2015-02-15 LAB — BASIC METABOLIC PANEL
ANION GAP: 5 (ref 5–15)
Anion gap: 7 (ref 5–15)
BUN: 17 mg/dL (ref 6–20)
BUN: 15 mg/dL (ref 6–20)
CALCIUM: 8.1 mg/dL — AB (ref 8.9–10.3)
CHLORIDE: 99 mmol/L — AB (ref 101–111)
CO2: 26 mmol/L (ref 22–32)
CO2: 27 mmol/L (ref 22–32)
CREATININE: 0.92 mg/dL (ref 0.61–1.24)
Calcium: 7.9 mg/dL — ABNORMAL LOW (ref 8.9–10.3)
Chloride: 101 mmol/L (ref 101–111)
Creatinine, Ser: 0.78 mg/dL (ref 0.61–1.24)
GFR calc Af Amer: >60 mL/min (ref >60)
Glucose, Bld: 119 mg/dL — ABNORMAL HIGH (ref 65–99)
Glucose, Bld: 120 mg/dL — ABNORMAL HIGH (ref 65–99)
Potassium: 4.3 mmol/L (ref 3.5–5.1)
Potassium: 4 mmol/L (ref 3.5–5.1)
SODIUM: 133 mmol/L — AB (ref 135–145)
Sodium: 132 mmol/L — ABNORMAL LOW (ref 135–145)

## 2015-02-15 LAB — CBC
HCT: 23.4 % — ABNORMAL LOW (ref 39.0–52.0)
Hemoglobin: 8 g/dL — ABNORMAL LOW (ref 13.0–17.0)
MCH: 30.8 pg (ref 26.0–34.0)
MCHC: 34.2 g/dL (ref 30.0–36.0)
MCV: 90 fL (ref 78.0–100.0)
Platelets: 157 10*3/uL (ref 150–400)
RBC: 2.6 MIL/uL — ABNORMAL LOW (ref 4.22–5.81)
RDW: 15.9 % — ABNORMAL HIGH (ref 11.5–15.5)
WBC: 6.6 10*3/uL (ref 4.0–10.5)

## 2015-02-15 MED ORDER — FERROUS SULFATE 325 (65 FE) MG PO TABS: 325.0000 mg | ORAL_TABLET | Freq: Two times a day (BID) | ORAL | Status: AC

## 2015-02-15 MED ORDER — FERROUS SULFATE 325 (65 FE) MG PO TABS
325.0000 mg | ORAL_TABLET | Freq: Three times a day (TID) | ORAL | Status: DC
  Administered 2015-02-16: 325 mg via ORAL
  Filled 2015-02-15: qty 1

## 2015-02-15 MED ORDER — OXYCODONE HCL 5 MG PO TABS: 5.0000 mg | ORAL_TABLET | ORAL | Status: AC | PRN

## 2015-02-15 MED ORDER — RIVAROXABAN 10 MG PO TABS: 10.0000 mg | ORAL_TABLET | Freq: Every day | ORAL | Status: AC

## 2015-02-15 NOTE — Progress Notes (Signed)
Occupational Therapy Evaluation Patient Details Name: Darryl Ray MRN: [id number] DOB: 02/12/1956 Today's Date: 02/15/2015    History of Present Illness pt is a 59 y/o male s/p R THA  PHHx:  ankylosising spondylitis   Clinical Impression   Patient presenting with decreased ADL, IADL, functional mobility independence secondary to above.  Patient independent -> mod I PTA. Patient currently is dependent with LB ADLs and requires min assist for functional mobility/transfers using RW. Patient will benefit from acute OT to increase overall independence in the areas of ADLs, functional mobility, and overall safety in order to safely discharge to venue listed below.     Follow Up Recommendations  SNF;Supervision/Assistance - 24 hour    Equipment Recommendations  Other (comment) (TBD next venue of care)    Recommendations for Other Services  None at this time   Precautions / Restrictions Precautions Precautions: Posterior Hip Required Braces or Orthoses: Knee Immobilizer - Right Knee Immobilizer - Right: On at all times (in bed) Restrictions Weight Bearing Restrictions: Yes RLE Weight Bearing: Weight bearing as tolerated    Mobility Bed Mobility Overal bed mobility: Needs Assistance Bed Mobility: Supine to Sit     Supine to sit: Min assist     General bed mobility comments: assistance for trunk control and assistance needed to maintain static sitting due to pain and general weakness  Transfers Overall transfer level: Needs assistance Equipment used: Rolling walker (2 wheeled) Transfers: Sit to/from Stand Sit to Stand: Min assist General transfer comment: cues for technique and hand placement    Balance Overall balance assessment: Needs assistance Sitting-balance support: No upper extremity supported;Feet supported Sitting balance-Leahy Scale: Poor     Standing balance support: Bilateral upper extremity supported;During functional activity Standing balance-Leahy Scale: Fair     ADL Overall ADL's : Needs assistance/impaired   General ADL Comments: Pt dependent with LB ADLs and requires min assist for functional mobiliy using RW. Pt will benefit from education on use of AE and DME to help increase overall independence.     Pertinent Vitals/Pain Pain Assessment: 0-10 Pain Score: 2  Pain Location: right hip Pain Descriptors / Indicators: Aching;Discomfort Pain Intervention(s): Monitored during session;Repositioned;Ice applied     Hand Dominance Right   Extremity/Trunk Assessment Upper Extremity Assessment Upper Extremity Assessment: Overall WFL for tasks assessed;RUE deficits/detail RUE Deficits / Details: dupuytren's contracture, patient plans to have surgery asap   Lower Extremity Assessment Lower Extremity Assessment: Defer to PT evaluation   Cervical / Trunk Assessment Cervical / Trunk Assessment: Kyphotic   Communication Communication Communication: No difficulties   Cognition Arousal/Alertness: Awake/alert Behavior During Therapy: WFL for tasks assessed/performed Overall Cognitive Status: Within Functional Limits for tasks assessed              Home Living Family/patient expects to be discharged to:: Skilled nursing facility Living Arrangements: Alone    Prior Functioning/Environment Level of Independence: Independent with assistive device(s)        Comments: using SPC for mobility PTA    OT Diagnosis: Generalized weakness;Acute pain   OT Problem List: Decreased strength;Decreased range of motion;Decreased activity tolerance;Impaired balance (sitting and/or standing);Decreased safety awareness;Decreased knowledge of use of DME or AE;Decreased knowledge of precautions;Pain   OT Treatment/Interventions: Self-care/ADL training;Therapeutic exercise;Energy conservation;DME and/or AE instruction;Therapeutic activities;Patient/family education;Balance training    OT Goals(Current goals can be found in the care plan section) Acute Rehab  OT Goals Patient Stated Goal: home alone after rehab OT Goal Formulation: With patient Time For Goal Achievement: 03/01/15 Potential to Achieve  Goals: Good ADL Goals Pt Will Perform Grooming: with modified independence;standing Pt Will Perform Lower Body Bathing: with min assist;sit to/from stand;with adaptive equipment Pt Will Perform Lower Body Dressing: with min assist;with adaptive equipment;sit to/from stand Pt Will Transfer to Toilet: with modified independence;ambulating;bedside commode Additional ADL Goal #1: Patient will independently verbalize and adhere to 3/3 posterior hip precautions 100% of the time  OT Frequency: Min 2X/week   Barriers to D/C: Decreased caregiver support   End of Session Equipment Utilized During Treatment: Gait belt;Rolling walker;Right knee immobilizer  Activity Tolerance: Patient tolerated treatment well Patient left: in chair;with call bell/phone within reach   Time: 1440-1456 OT Time Calculation (min): 16 min Charges:  OT General Charges $OT Visit: 1 Procedure OT Evaluation $Initial OT Evaluation Tier I: 1 Procedure  Rehana Uncapher , MS, OTR/L, CLT Pager: [phone]  02/15/2015, 3:13 PM

## 2015-02-15 NOTE — Progress Notes (Signed)
Physical Therapy Treatment Patient Details Name: Darryl Ray MRN: [id number] DOB: 1956-02-26 Today's Date: 02/15/2015    History of Present Illness pt is a 59 y/o male s/p R THA  PHHx:  ankylosising spondylitis    PT Comments    Pt is s/p THA resulting in the deficits listed below (see PT Problem List). Pt will benefit from skilled PT to increase their independence and safety with mobility to allow discharge to the venue listed below. Recommending further rehabilitation via CIR or SNF before return home alone.    Follow Up Recommendations  CIR;SNF (recommending SNF if CIR not an option)     Equipment Recommendations       Recommendations for Other Services       Precautions / Restrictions Precautions Precautions: Posterior Hip Required Braces or Orthoses: Knee Immobilizer - Right Knee Immobilizer - Right: On at all times Restrictions Weight Bearing Restrictions: Yes RLE Weight Bearing: Weight bearing as tolerated    Mobility  Bed Mobility Overal bed mobility: Needs Assistance Bed Mobility: Supine to Sit     Supine to sit: Min assist     General bed mobility comments: assit at trunk and RLE  Transfers Overall transfer level: Needs assistance Equipment used: Rolling walker (2 wheeled) Transfers: Sit to/from Stand Sit to Stand: Min assist         General transfer comment: cues for technique and hand placement  Ambulation/Gait Ambulation/Gait assistance: Min guard Ambulation Distance (Feet): 100 Feet Assistive device: Rolling walker (2 wheeled) Gait Pattern/deviations: Step-through pattern;Decreased step length - right;Decreased weight shift to right   Gait velocity interpretation: Below normal speed for age/gender     Stairs            Wheelchair Mobility    Modified Rankin (Stroke Patients Only)       Balance Overall balance assessment: Needs assistance Sitting-balance support: Bilateral upper extremity supported Sitting balance-Leahy  Scale: Poor     Standing balance support: Bilateral upper extremity supported Standing balance-Leahy Scale: Poor                      Cognition Arousal/Alertness: Awake/alert Behavior During Therapy: WFL for tasks assessed/performed Overall Cognitive Status: Within Functional Limits for tasks assessed       Memory: Decreased recall of precautions (recalled 1/3 precautions, reviewed)              Exercises      General Comments        Pertinent Vitals/Pain Pain Assessment: 0-10 Pain Score: 3  Pain Location: Rt hip Pain Descriptors / Indicators: Aching Pain Intervention(s): Monitored during session;Repositioned    Home Living Family/patient expects to be discharged to:: Skilled nursing facility Living Arrangements: Alone                  Prior Function Level of Independence: Independent with assistive device(s)      Comments: using SPC for mobility PTA   PT Goals (current goals can now be found in the care plan section) Acute Rehab PT Goals Patient Stated Goal: home alone after rehab PT Goal Formulation: With patient Time For Goal Achievement: 02/21/15 Potential to Achieve Goals: Good Progress towards PT goals: Progressing toward goals    Frequency  Min 3X/week    PT Plan Discharge plan needs to be updated    Co-evaluation             End of Session Equipment Utilized During Treatment: Gait belt;Right knee immobilizer Activity Tolerance: Patient  tolerated treatment well Patient left: in chair;with call bell/phone within reach     Time: 4098-11911047-1114 PT Time Calculation (min) (ACUTE ONLY): 27 min  Charges:  $Gait Training: 8-22 mins $Therapeutic Activity: 8-22 mins                    G Codes:      Christiane HaBenjamin J. Aime Carreras, PT, CSCS Pager [phone] Office [phone]  02/15/2015, 3:40 PM

## 2015-02-15 NOTE — Clinical Social Work Placement (Signed)
   CLINICAL SOCIAL WORK PLACEMENT  NOTE  Date:  02/15/2015  Patient Details  Name: Darryl Ray MRN: [id number] Date of Birth: 06/12/1956  Clinical Social Work is seeking post-discharge placement for this patient at the Skilled  Nursing Facility level of care (*CSW will initial, date and re-position this form in  chart as items are completed):  Yes   Patient/family provided with [facility] Clinical Social Work Department's list of facilities offering this level of care within the geographic area requested by the patient (or if unable, by the patient's family).  Yes   Patient/family informed of their freedom to choose among providers that offer the needed level of care, that participate in Medicare, Medicaid or managed care program needed by the patient, have an available bed and are willing to accept the patient.  Yes   Patient/family informed of [facility]'s ownership interest in [facility] and [facility], as well as of the fact that they are under no obligation to receive care at these facilities.  PASRR submitted to EDS on 02/15/15     PASRR number received on 02/15/15     Existing PASRR number confirmed on       FL2 transmitted to all facilities in geographic area requested by pt/family on 02/15/15     FL2 transmitted to all facilities within larger geographic area on       Patient informed that his/her managed care company has contracts with or will negotiate with certain facilities, including the following:            Patient/family informed of bed offers received.  Patient chooses bed at       Physician recommends and patient chooses bed at      Patient to be transferred to   on  .  Patient to be transferred to facility by       Patient family notified on   of transfer.  Name of family member notified:        PHYSICIAN       Additional Comment:    _______________________________________________ Rondel BatonIngle, Kathrene Sinopoli C, LCSW 02/15/2015, 12:37 PM

## 2015-02-15 NOTE — Progress Notes (Signed)
Rehab Admissions Coordinator Note:  Patient was screened by Trish MageLogue, Scharlene Catalina M for appropriateness for an Inpatient Acute Rehab Consult.  At this time, we are recommending HH.  It is highly unlikely that [employer]BS will approve acute inpatient rehab admission for current diagnosis.  May need SNF if progress is slow.  Call me for questions.    Lelon FrohlichLogue, Anahi Belmar M 02/15/2015, 8:28 AM  I can be reached at [phone].

## 2015-02-15 NOTE — Progress Notes (Signed)
Patient ID: Darryl Ray, male   DOB: 05/11/1956, 59 y.o.   MRN: [id number] PATIENT ID: Darryl Ray        MRN:  [id number]          DOB/AGE: [birth year] / 59 y.o.    Darryl Campbell, MD   Darryl Code, PA-C [address] [place], [place]  [address]                             [phone]   PROGRESS NOTE  Subjective:  negative for Chest Pain  negative for Shortness of Breath  negative for Nausea/Vomiting   negative for Calf Pain    Tolerating Diet: yes         Patient reports pain as mild.     Comfortable night-  Objective: Vital signs in last 24 hours:   Patient Vitals for the past 24 hrs:  BP Temp Temp src Pulse Resp SpO2  02/15/15 0515 (!) 115/54 mmHg 98.1 F (36.7 C) Oral 92 16 99 %  02/15/15 0138 131/74 mmHg 98.4 F (36.9 C) Oral 94 16 99 %  02/14/15 2036 125/73 mmHg 98.1 F (36.7 C) Oral 88 16 97 %  02/14/15 1447 113/74 mmHg 97.8 F (36.6 C) - 84 16 97 %  02/14/15 1315 - - - 77 13 95 %  02/14/15 1300 (!) 98/59 mmHg - - 74 10 95 %  02/14/15 1245 - - - 75 (!) 7 93 %  02/14/15 1230 - - - 77 (!) 8 93 %  02/14/15 1215 - - - 80 11 95 %  02/14/15 1200 (!) 114/58 mmHg - - 78 (!) 6 95 %  02/14/15 1145 - - - 82 (!) 8 92 %  02/14/15 1130 - - - 77 (!) 9 93 %  02/14/15 1115 - - - 74 11 96 %  02/14/15 1100 105/89 mmHg 98 F (36.7 C) - 93 15 99 %  02/14/15 1045 105/62 mmHg - - 83 - 100 %  02/14/15 1030 (!) 113/57 mmHg - - 92 16 100 %  02/14/15 1015 111/62 mmHg - - 85 15 -  02/14/15 1000 123/66 mmHg - - 93 18 100 %  02/14/15 0945 - - - 85 20 100 %  02/14/15 0940 119/69 mmHg 98.4 F (36.9 C) - 100 (!) 23 100 %      Intake/Output from previous day:   07/19 0701 - 07/20 0700 In: 2040 [P.O.:240; I.V.:1800] Out: 1350 [Urine:950]   Intake/Output this shift:       Intake/Output      07/19 0701 - 07/20 0700 07/20 0701 - 07/21 0700   P.O. 240    I.V. (mL/kg) 1800 (24.7)    Total Intake(mL/kg) 2040 (28)    Urine (mL/kg/hr) 950 (0.5)    Blood 400 (0.2)    Total Output  1350     Net +690             LABORATORY DATA:  Recent Labs  02/15/15 0447  WBC 6.6  HGB 8.0*  HCT 23.4*  PLT 157    Recent Labs  02/15/15 0447  NA 132*  K 4.3  CL 99*  CO2 26  BUN 17  CREATININE 0.92  GLUCOSE 119*  CALCIUM 7.9*   Lab Results  Component Value Date   INR 0.94 02/03/2015    Recent Radiographic Studies :  Dg Chest 2 View  02/03/2015   CLINICAL DATA:  Pre-admission study prior  to right hip arthroplasty; history of previous tobacco use, hypertension, and cardiac dysrhythmia.  EXAM: CHEST  2 VIEW  COMPARISON:  None.  FINDINGS: The lungs are adequately inflated and clear. The heart and pulmonary vascularity are normal. The mediastinum is normal in width. There is no pleural effusion. The patient has undergone an stabilization procedure due to compression fracture at L2. There are pedicle screws and connecting rods from T11 inferiorly  IMPRESSION: There is no active cardiopulmonary disease.   Electronically Signed   By: David  SwazilandJordan M.D.   On: 02/03/2015 16:26   Dg Hip Port Unilat With Pelvis 1v Right  02/14/2015   CLINICAL DATA:  Right hip replacement  EXAM: DG HIP (WITH OR WITHOUT PELVIS) 1V PORT RIGHT  COMPARISON:  None.  FINDINGS: Lateral imaging is limited by over penetration, limiting evaluation of the femoral stem.  Total right hip arthroplasty with no periprosthetic fracture or dislocation.  Left hip osteoarthritis with large cyst in the subchondral femoral head neck. The femoral head is aspherical, suspect prior avascular necrosis.  IMPRESSION: 1. No adverse findings after total right hip arthroplasty. 2. Left hip osteoarthritis with large femoral head cyst, probable remote avascular necrosis.   Electronically Signed   By: Marnee SpringJonathon  Watts M.D.   On: 02/14/2015 10:40     Examination:  General appearance: alert, cooperative and no distress  Wound Exam: clean, dry, intact   Drainage:  Scant/small amount Serosanguinous exudate  Motor Exam: EHL, FHL,  Anterior Tibial and Posterior Tibial Intact  Sensory Exam: Superficial Peroneal, Deep Peroneal and Tibial normal  Vascular Exam: Normal  Assessment:    1 Day Post-Op  Procedure(s) (LRB): TOTAL HIP ARTHROPLASTY (Right)  ADDITIONAL DIAGNOSIS:  Principal Problem:   Avascular necrosis of right femoral head Active Problems:   Avascular necrosis of left femoral head   AS (ankylosing spondylitis)   Essential hypertension, benign   S/P total hip arthroplasty  Acute Blood Loss Anemia-asymptomatic-will repeat labs this pm Hyponatremia  Plan: Physical Therapy as ordered Weight Bearing as Tolerated (WBAT)  DVT Prophylaxis:  Xarelto, Foot Pumps and TED hose  DISCHARGE PLAN: Skilled Nursing Facility/Rehab  DISCHARGE NEEDS: Walker and 3-in-1 comode seat    OOB with PT, pt would prefer rehab in [place]-social services to assist, saline lock IV, repeat labs this pm    Darryl Ray, Darryl Behney W  02/15/2015 7:58 AM

## 2015-02-15 NOTE — Clinical Social Work Note (Signed)
Clinical Social Work Assessment  Patient Details  Name: Darryl Ray MRN: [id number] Date of Birth: 10/24/1955  Date of referral:  02/15/15               Reason for consult:  Facility Placement                Permission sought to share information with:  Oceanographer[facility]ility Contact Representative Permission granted to share information::  Yes, Verbal Permission Granted  Name::        Agency::   [employer]([place] [facility] [place]  Relationship::     Contact Information:     Housing/Transportation Living arrangements for the past 2 months:  Single Family Home Source of Information:  Patient Patient Interpreter Needed:  None Criminal Activity/Legal Involvement Pertinent to Current Situation/Hospitalization:  No - Comment as needed Significant Relationships:  Significant Other Lives with:  Self Do you feel safe going back to the place where you live?  No (fall risk) Need for family participation in patient care:     Care giving concerns:  No caregiver present at time of discharge   Social Worker assessment / plan:  Patient has been recommended for STR at SNF at time of discharge. [place] and would like for CSW to check on bed availability for [facility].  Employment status:  Disabled (Comment on whether or not currently receiving Disability) Insurance information:  Medicare PT Recommendations:  24 Hour Supervision, Skilled Nursing Facility Information / Referral to community resources:  Skilled Nursing Facility  Patient/Family's Response to care:  Patient is agreeable  Patient/Family's Understanding of and Emotional Response to Diagnosis, Current Treatment, and Prognosis:  Patient is realistic of prognosis.  Emotional Assessment Appearance:  Appears stated age Attitude/Demeanor/Rapport:   (appropriate) Affect (typically observed):  Accepting, Adaptable Orientation:  Oriented to Self, Oriented to Place, Oriented to Situation, Oriented to  Time Alcohol /  Substance use:    Psych involvement (Current and /or in the community):  No (Comment)  Discharge Needs  Concerns to be addressed:  No discharge needs identified Readmission within the last 30 days:    Current discharge risk:  None Barriers to Discharge:  No Barriers Identified   Rondel Batonngle, Tecora Eustache C, LCSW 02/15/2015, 12:35 PM

## 2015-02-15 NOTE — Discharge Summary (Signed)
Darryl Campbell, MD   Darryl Code, PA-C [address] [place], [place]  [address]                             [phone]  PATIENT ID: Darryl Ray        MRN:  [id number]          DOB/AGE: [birth year] / 58 y.o.    DISCHARGE SUMMARY  ADMISSION DATE:    02/14/2015 DISCHARGE DATE:   02/16/2015   ADMISSION DIAGNOSIS: AVASCULAR NECROSIS OF RIGHT HIP    DISCHARGE DIAGNOSIS:  AVASCULAR NECROSIS OF RIGHT HIP    ADDITIONAL DIAGNOSIS: Principal Problem:   Avascular necrosis of right femoral head Active Problems:   Avascular necrosis of left femoral head   AS (ankylosing spondylitis)   Essential hypertension, benign   S/P total hip arthroplasty  Past Medical History  Diagnosis Date  . Hypertension   . Dysrhythmia     irregular on occasion  . Anxiety   . Depression   . GERD (gastroesophageal reflux disease)   . Arthritis   . Hearing loss     from measeles- left ear worse    PROCEDURE: Procedure(s): TOTAL HIP ARTHROPLASTY Right on 02/14/2015  CONSULTS: none     HISTORY: Darryl Ray is a very pleasant, 59 year old, white man who is seen today for evaluation. He is a patient of Dr. Corliss Skains who has been complaining about right hip pain. He has had a history of ankylosing spondylitis and has been also having multiple complaints of arthralgias. Particularly, his right hip is painful and rather excruciating now, but the left hip is less painful. He has just recently moved to the [place] area from [place] where he had a long history of the ankylosing spondylitis. He is having difficulty now with getting in and out of cars as well as with ambulation. He has pain with almost every step now. He has difficulty with range of motion of the hip secondary to pain and discomfort. He has difficulty with activities of daily living, and even just getting dressed or putting his shoes on has caused him significant discomfort. Any motion causes him pain and discomfort mainly in the anterior  groin area. He is now to the point where he has pain with every step, pain at night time, and pain during the day, and ithas malingered to the point where he has had difficulty caring for himself. Note though he has had radiographic studies previously which do reveal avascular necrosis of both hips with marked deformity on the right in comparison to the left. He is seen today for evaluation.  HOSPITAL COURSE:  Darryl Ray is a 59 y.o. admitted on 02/14/2015 and found to have a diagnosis of AVASCULAR NECROSIS OF RIGHT HIP.  After appropriate laboratory studies were obtained  they were taken to the operating room on 02/14/2015 and underwent  Procedure(s): TOTAL HIP ARTHROPLASTY  Right.   They were given perioperative antibiotics:  Anti-infectives    Start     Dose/Rate Route Frequency Ordered Stop   02/14/15 1400  vancomycin (VANCOCIN) IVPB 1000 mg/200 mL premix  Status:  Discontinued     1,000 mg 200 mL/hr over 60 Minutes Intravenous Every 12 hours 02/14/15 1345 02/14/15 1435   02/14/15 0711  vancomycin (VANCOCIN) 1 GM/200ML IVPB    Comments:  [name]   : cabinet override      02/14/15 0711 02/14/15 0810    .  Tolerated  the procedure well.  Placed with a foley intraoperatively.    Toradol was given post op.  POD #1, allowed out of bed to a chair.  PT for ambulation and exercise program.  IV saline locked.  O2 discontionued.  POD #2, continued PT and ambulation.  Denied any dizziness.  POD #3, continued PT and ambulation  The remainder of the hospital course was dedicated to ambulation and strengthening.   The patient was discharged on 1 Day Post-Op in  Stable condition.  Blood products given:none  DIAGNOSTIC STUDIES: Recent vital signs: Patient Vitals for the past 24 hrs:  BP Temp Temp src Pulse Resp SpO2  02/15/15 1943 (!) 112/52 mmHg 98.4 F (36.9 C) - 93 18 99 %  02/15/15 1400 (!) 98/47 mmHg 98.5 F (36.9 C) - 91 18 100 %  02/15/15 0515 (!) 115/54 mmHg 98.1 F (36.7 C)  Oral 92 16 99 %  02/15/15 0138 131/74 mmHg 98.4 F (36.9 C) Oral 94 16 99 %  02/14/15 2036 125/73 mmHg 98.1 F (36.7 C) Oral 88 16 97 %       Recent laboratory studies:  Recent Labs  02/15/15 0447 02/15/15 1205  WBC 6.6 6.3  HGB 8.0* 8.0*  HCT 23.4* 23.5*  PLT 157 155    Recent Labs  02/15/15 0447 02/15/15 1205  NA 132* 133*  K 4.3 4.0  CL 99* 101  CO2 26 27  BUN 17 15  CREATININE 0.92 0.78  GLUCOSE 119* 120*  CALCIUM 7.9* 8.1*   Lab Results  Component Value Date   INR 0.94 02/03/2015     Recent Radiographic Studies :  Dg Chest 2 View  02/03/2015   CLINICAL DATA:  Pre-admission study prior to right hip arthroplasty; history of previous tobacco use, hypertension, and cardiac dysrhythmia.  EXAM: CHEST  2 VIEW  COMPARISON:  None.  FINDINGS: The lungs are adequately inflated and clear. The heart and pulmonary vascularity are normal. The mediastinum is normal in width. There is no pleural effusion. The patient has undergone an stabilization procedure due to compression fracture at L2. There are pedicle screws and connecting rods from T11 inferiorly  IMPRESSION: There is no active cardiopulmonary disease.   Electronically Signed   By: David  [place] M.D.   On: 02/03/2015 16:26   Dg Hip Port Unilat With Pelvis 1v Right  02/14/2015   CLINICAL DATA:  Right hip replacement  EXAM: DG HIP (WITH OR WITHOUT PELVIS) 1V PORT RIGHT  COMPARISON:  None.  FINDINGS: Lateral imaging is limited by over penetration, limiting evaluation of the femoral stem.  Total right hip arthroplasty with no periprosthetic fracture or dislocation.  Left hip osteoarthritis with large cyst in the subchondral femoral head neck. The femoral head is aspherical, suspect prior avascular necrosis.  IMPRESSION: 1. No adverse findings after total right hip arthroplasty. 2. Left hip osteoarthritis with large femoral head cyst, probable remote avascular necrosis.   Electronically Signed   By: Marnee Spring M.D.   On:  02/14/2015 10:40    DISCHARGE INSTRUCTIONS: Discharge Instructions    Call MD / Call 911    Complete by:  As directed   If you experience chest pain or shortness of breath, CALL 911 and be transported to the hospital emergency room.  If you develope a fever above 101 F, pus (white drainage) or increased drainage or redness at the wound, or calf pain, call your surgeon's office.     Change dressing    Complete by:  As directed   DO NOT CHANGE DRESSING. KEEP ON TILL SEEN IN THE OFFICE     Constipation Prevention    Complete by:  As directed   Drink plenty of fluids.  Prune juice may be helpful.  You may use a stool softener, such as Colace (over the counter) 100 mg twice a day.  Use MiraLax (over the counter) for constipation as needed.     Diet general    Complete by:  As directed      Discharge instructions    Complete by:  As directed   INSTRUCTIONS AFTER JOINT REPLACEMENT   Remove items at home which could result in a fall. This includes throw rugs or furniture in walking pathways ICE to the affected joint every three hours while awake for 30 minutes at a time, for at least the first 3-5 days, and then as needed for pain and swelling.  Continue to use ice for pain and swelling. You may notice swelling that will progress down to the foot and ankle.  This is normal after surgery.  Elevate your leg when you are not up walking on it.   Continue to use the breathing machine you got in the hospital (incentive spirometer) which will help keep your temperature down.  It is common for your temperature to cycle up and down following surgery, especially at night when you are not up moving around and exerting yourself.  The breathing machine keeps your lungs expanded and your temperature down.   DIET:  As you were doing prior to hospitalization, we recommend a well-balanced diet.  DRESSING / WOUND CARE / SHOWERING  Keep the surgical dressing until follow up.  The dressing is water proof, so you can  shower without any extra covering.  IF THE DRESSING FALLS OFF or the wound gets wet inside, change the dressing with sterile gauze.  Please use good hand washing techniques before changing the dressing.  Do not use any lotions or creams on the incision until instructed by your surgeon.    ACTIVITY  Increase activity slowly as tolerated, but follow the weight bearing instructions below.   No driving for 6 weeks or until further direction given by your physician.  You cannot drive while taking narcotics.  No lifting or carrying greater than 10 lbs. until further directed by your surgeon. Avoid periods of inactivity such as sitting longer than an hour when not asleep. This helps prevent blood clots.  You may return to work once you are authorized by your doctor.     WEIGHT BEARING   Weight bearing as tolerated with assist device (walker, cane, etc) as directed, use it as long as suggested by your surgeon or therapist, typically at least 4-6 weeks.   EXERCISES  Results after joint replacement surgery are often greatly improved when you follow the exercise, range of motion and muscle strengthening exercises prescribed by your doctor. Safety measures are also important to protect the joint from further injury. Any time any of these exercises cause you to have increased pain or swelling, decrease what you are doing until you are comfortable again and then slowly increase them. If you have problems or questions, call your caregiver or physical therapist for advice.   Rehabilitation is important following a joint replacement. After just a few days of immobilization, the muscles of the leg can become weakened and shrink (atrophy).  These exercises are designed to build up the tone and strength of the thigh and leg muscles and  to improve motion. Often times heat used for twenty to thirty minutes before working out will loosen up your tissues and help with improving the range of motion but do not use heat  for the first two weeks following surgery (sometimes heat can increase post-operative swelling).   These exercises can be done on a training (exercise) mat, on a table or on a bed. Use whatever works the best and is most comfortable for you.    Use music or television while you are exercising so that the exercises are a pleasant break in your day. This will make your life better with the exercises acting as a break in your routine that you can look forward to.   Perform all exercises about fifteen times, three times per day or as directed.  You should exercise both the operative leg and the other leg as well.   Exercises include:   Quad Sets - Tighten up the muscle on the front of the thigh (Quad) and hold for 5-10 seconds.   Straight Leg Raises - With your knee straight (if you were given a brace, keep it on), lift the leg to 60 degrees, hold for 3 seconds, and slowly lower the leg.  Perform this exercise against resistance later as your leg gets stronger.  Leg Slides: Lying on your back, slowly slide your foot toward your buttocks, bending your knee up off the floor (only go as far as is comfortable). Then slowly slide your foot back down until your leg is flat on the floor again.  Angel Wings: Lying on your back spread your legs to the side as far apart as you can without causing discomfort.  Hamstring Strength:  Lying on your back, push your heel against the floor with your leg straight by tightening up the muscles of your buttocks.  Repeat, but this time bend your knee to a comfortable angle, and push your heel against the floor.  You may put a pillow under the heel to make it more comfortable if necessary.   A rehabilitation program following joint replacement surgery can speed recovery and prevent re-injury in the future due to weakened muscles. Contact your doctor or a physical therapist for more information on knee rehabilitation.    CONSTIPATION  Constipation is defined medically as fewer  than three stools per week and severe constipation as less than one stool per week.  Even if you have a regular bowel pattern at home, your normal regimen is likely to be disrupted due to multiple reasons following surgery.  Combination of anesthesia, postoperative narcotics, change in appetite and fluid intake all can affect your bowels.   YOU MUST use at least one of the following options; they are listed in order of increasing strength to get the job done.  They are all available over the counter, and you may need to use some, POSSIBLY even all of these options:    Drink plenty of fluids (prune juice may be helpful) and high fiber foods Colace 100 mg by mouth twice a day  Senokot for constipation as directed and as needed Dulcolax (bisacodyl), take with full glass of water  Miralax (polyethylene glycol) once or twice a day as needed.  If you have tried all these things and are unable to have a bowel movement in the first 3-4 days after surgery call either your surgeon or your primary doctor.    If you experience loose stools or diarrhea, hold the medications until you stool forms back up.  If your symptoms do not get better within 1 week or if they get worse, check with your doctor.  If you experience "the worst abdominal pain ever" or develop nausea or vomiting, please contact the office immediately for further recommendations for treatment.   ITCHING:  If you experience itching with your medications, try taking only a single pain pill, or even half a pain pill at a time.  You can also use Benadryl over the counter for itching or also to help with sleep.   TED HOSE STOCKINGS:  Use stockings on both legs until for at least 2 weeks or as directed by physician office. They may be removed at night for sleeping.  MEDICATIONS:  See your medication summary on the "After Visit Summary" that nursing will review with you.  You may have some home medications which will be placed on hold until you complete  the course of blood thinner medication.  It is important for you to complete the blood thinner medication as prescribed.  PRECAUTIONS:  If you experience chest pain or shortness of breath - call 911 immediately for transfer to the hospital emergency department.   If you develop a fever greater that 101 F, purulent drainage from wound, increased redness or drainage from wound, foul odor from the wound/dressing, or calf pain - CONTACT YOUR SURGEON.                                                   FOLLOW-UP APPOINTMENTS:  If you do not already have a post-op appointment, please call the office for an appointment to be seen by your surgeon.  Guidelines for how soon to be seen are listed in your "After Visit Summary", but are typically between 1-4 weeks after surgery.  OTHER INSTRUCTIONS:   Knee Replacement:  Do not place pillow under knee, focus on keeping the knee straight while resting. CPM instructions: 0-90 degrees, 2 hours in the morning, 2 hours in the afternoon, and 2 hours in the evening. Place foam block, curve side up under heel at all times except when in CPM or when walking.  DO NOT modify, tear, cut, or change the foam block in any way.  MAKE SURE YOU:  Understand these instructions.  Get help right away if you are not doing well or get worse.    Thank you for letting us be a part of your medical care team.  It is a privilege we respect greatly.  We hope these instructions will help you stay on track for a fast and full recovery!     Driving restrictions    Complete by:  As directed   No driving for 6 weeks     Follow the hip precautions as taught in Physical Therapy    Complete by:  As directed      Increase activity slowly as tolerated    Complete by:  As directed      Lifting restrictions    Complete by:  As directed   No lifting for 6 weeks     Patient may shower    Complete by:  As directed   YOU MAY SHOWER OVER THE DRESSING.  DO NOT REMOVE DRESSING     TED hose     Complete by:  As directed   Use stockings (TED hose) for 2 weeks on RIGHT  leg(s).  You may remove them at night for sleeping.     Weight bearing as tolerated    Complete by:  As directed   Laterality:  right  Extremity:  Lower           DISCHARGE MEDICATIONS:     Medication List    STOP taking these medications        HYDROcodone-acetaminophen 7.5-325 MG per tablet  Commonly known as:  NORCO      TAKE these medications        allopurinol 300 MG tablet  Commonly known as:  ZYLOPRIM  Take 300 mg by mouth daily.     baclofen 10 MG tablet  Commonly known as:  LIORESAL  Take 10 mg by mouth 2 (two) times daily as needed for muscle spasms.     citalopram 40 MG tablet  Commonly known as:  CELEXA  Take 40 mg by mouth daily.     ferrous sulfate 325 (65 FE) MG tablet  Take 1 tablet (325 mg total) by mouth 2 (two) times daily with a meal.     hydrochlorothiazide 25 MG tablet  Commonly known as:  HYDRODIURIL  Take 25 mg by mouth daily.     MULTIVITAMIN & MINERAL PO  Take 1 tablet by mouth daily.     omeprazole 40 MG capsule  Commonly known as:  PRILOSEC  Take 40 mg by mouth daily.     oxyCODONE 5 MG immediate release tablet  Commonly known as:  Oxy IR/ROXICODONE  Take 1-2 tablets (5-10 mg total) by mouth every 4 (four) hours as needed for moderate pain, severe pain or breakthrough pain.     pravastatin 80 MG tablet  Commonly known as:  PRAVACHOL  Take 80 mg by mouth daily.     quinapril 40 MG tablet  Commonly known as:  ACCUPRIL  Take 40 mg by mouth every morning.     rivaroxaban 10 MG Tabs tablet  Commonly known as:  XARELTO  Take 1 tablet (10 mg total) by mouth daily with breakfast.     TESTOSTERONE IM  Inject into the muscle every 30 (thirty) days.        FOLLOW UP VISIT:       Follow-up Information    [facility] Claude MangesPETER W, MD. Schedule an appointment as soon as possible for a visit on 02/27/2015.   Specialty:  Orthopedic Surgery   Contact  information:   [address] [place] [address] [phone]       DISPOSITION:   Skilled Nursing Facility/Rehab  CONDITION:  Stable   Oris DroneBrian D. Aleda Granaetrarca, PA-C [facility] [phone]  02/15/2015 7:51 PM

## 2015-02-16 ENCOUNTER — Encounter (HOSPITAL_COMMUNITY): Payer: Self-pay | Admitting: Orthopaedic Surgery

## 2015-02-16 LAB — BASIC METABOLIC PANEL
Anion gap: 8 (ref 5–15)
BUN: 9 mg/dL (ref 6–20)
CALCIUM: 8.5 mg/dL — AB (ref 8.9–10.3)
CO2: 28 mmol/L (ref 22–32)
Chloride: 98 mmol/L — ABNORMAL LOW (ref 101–111)
Creatinine, Ser: 0.88 mg/dL (ref 0.61–1.24)
GLUCOSE: 105 mg/dL — AB (ref 65–99)
Potassium: 4 mmol/L (ref 3.5–5.1)
Sodium: 134 mmol/L — ABNORMAL LOW (ref 135–145)

## 2015-02-16 LAB — CBC
HEMATOCRIT: 22.6 % — AB (ref 39.0–52.0)
Hemoglobin: 7.6 g/dL — ABNORMAL LOW (ref 13.0–17.0)
MCH: 30 pg (ref 26.0–34.0)
MCHC: 33.6 g/dL (ref 30.0–36.0)
MCV: 89.3 fL (ref 78.0–100.0)
Platelets: 162 10*3/uL (ref 150–400)
RBC: 2.53 MIL/uL — AB (ref 4.22–5.81)
RDW: 15.9 % — ABNORMAL HIGH (ref 11.5–15.5)
WBC: 7.3 10*3/uL (ref 4.0–10.5)

## 2015-02-16 NOTE — Progress Notes (Signed)
Report called to [facility].ptd/cd per ambulance.

## 2015-02-16 NOTE — Discharge Planning (Signed)
Patient will discharge today per MD order. Patient will discharge to [facility] RN to call report prior to transportation to: [phone] room 220 Transportation: [facility]  Scheduled at 12pm  CSW sent discharge summary to SNF for review.  Packet is complete.  RN, patient and family aware of discharge plans.  Vickii Penna, LCSWA [phone]  Psychiatric & Orthopedics (5N 1-16) Clinical Social Worker

## 2015-02-16 NOTE — Progress Notes (Signed)
Physical Therapy Treatment Patient Details Name: Darryl Ray MRN: [id number] DOB: [birth year] Today's Date: 02/16/2015    History of Present Illness pt is a 59 y/o male s/p R THA  PHHx:  ankylosising spondylitis    PT Comments    Patient is making good progress with PT.  From a mobility standpoint anticipate patient will be ready for DC to SNF .     Follow Up Recommendations  SNF     Equipment Recommendations  Rolling walker with 5" wheels;3in1 (PT)    Recommendations for Other Services       Precautions / Restrictions Precautions Precautions: Posterior Hip Required Braces or Orthoses: Other Brace/Splint Knee Immobilizer - Right: Other (comment) (Patient refusing knee immobilizer with ambulation) Restrictions Weight Bearing Restrictions: Yes RLE Weight Bearing: Weight bearing as tolerated    Mobility  Bed Mobility                  Transfers Overall transfer level: Needs assistance Equipment used: Rolling walker (2 wheeled) Transfers: Sit to/from Stand Sit to Stand: Min guard            Ambulation/Gait Ambulation/Gait assistance: Min guard Ambulation Distance (Feet): 150 Feet Assistive device: Rolling walker (2 wheeled) Gait Pattern/deviations: Step-to pattern;Decreased weight shift to right   Gait velocity interpretation: Below normal speed for age/gender     Stairs            Wheelchair Mobility    Modified Rankin (Stroke Patients Only)       Balance Overall balance assessment: Needs assistance Sitting-balance support: No upper extremity supported Sitting balance-Leahy Scale: Good     Standing balance support: Bilateral upper extremity supported Standing balance-Leahy Scale: Poor                      Cognition Arousal/Alertness: Awake/alert Behavior During Therapy: WFL for tasks assessed/performed Overall Cognitive Status: Within Functional Limits for tasks assessed       Memory: Decreased recall of precautions (2/3  reviewed)              Exercises      General Comments        Pertinent Vitals/Pain Pain Assessment: 0-10 Pain Score: 8  Pain Location: Rt hip Pain Descriptors / Indicators: Sore Pain Intervention(s): Monitored during session    Home Living                      Prior Function            PT Goals (current goals can now be found in the care plan section) Acute Rehab PT Goals Patient Stated Goal: home alone after rehab PT Goal Formulation: With patient Time For Goal Achievement: 02/21/15 Potential to Achieve Goals: Good Progress towards PT goals: Progressing toward goals    Frequency  Min 3X/week    PT Plan Current plan remains appropriate    Co-evaluation             End of Session Equipment Utilized During Treatment: Gait belt Activity Tolerance: Patient tolerated treatment well Patient left: in chair;with call bell/phone within reach     Time: 1100-1124 PT Time Calculation (min) (ACUTE ONLY): 24 min  Charges:  $Gait Training: 23-37 mins                    G Codes:      Christiane Ha, PT, CSCS Pager [phone] Office [phone]  02/16/2015, 12:53 PM

## 2015-02-16 NOTE — Progress Notes (Signed)
Subjective: 2 Days Post-Op Procedure(s) (LRB): TOTAL HIP ARTHROPLASTY (Right) Patient reports pain as mild and moderate.   Denies any dizziness.  Tolerated OOB  Objective: Vital signs in last 24 hours: Temp:  [98.4 F (36.9 C)-99.1 F (37.3 C)] 99.1 F (37.3 C) (07/21 0457) Pulse Rate:  [91-110] 110 (07/21 0457) Resp:  [18] 18 (07/21 0457) BP: (98-132)/(47-76) 132/76 mmHg (07/21 0457) SpO2:  [96 %-100 %] 96 % (07/21 0457)  Intake/Output from previous day: 07/20 0701 - 07/21 0700 In: 1080 [P.O.:1080] Out: 1650 [Urine:1650] Intake/Output this shift: Total I/O In: 360 [P.O.:360] Out: 300 [Urine:300]   Recent Labs  02/15/15 0447 02/15/15 1205 02/16/15 0331  HGB 8.0* 8.0* 7.6*    Recent Labs  02/15/15 1205 02/16/15 0331  WBC 6.3 7.3  RBC 2.60* 2.53*  HCT 23.5* 22.6*  PLT 155 162    Recent Labs  02/15/15 1205 02/16/15 0331  NA 133* 134*  K 4.0 4.0  CL 101 98*  CO2 27 28  BUN 15 9  CREATININE 0.78 0.88  GLUCOSE 120* 105*  CALCIUM 8.1* 8.5*   No results for input(s): LABPT, INR in the last 72 hours.  Neurologically intact Neurovascular intact Sensation intact distally Dorsiflexion/Plantar flexion intact Incision: dressing C/D/I  Assessment/Plan: 2 Days Post-Op Procedure(s) (LRB): TOTAL HIP ARTHROPLASTY (Right) Discharge to SNF  [facility] 02/16/2015, 10:04 AM

## 2015-02-16 NOTE — Care Management Note (Signed)
Case Management Note  Patient Details  Name: Darryl Ray MRN: [id number] Date of Birth: [birth year]  Subjective/Objective:   59 yr old male s/p right total hip arthroplasty                 Action/Plan:    Expected Discharge Date:                  Expected Discharge Plan:  Skilled Nursing Facility  In-House Referral:  Clinical Social Work  Discharge planning Services  CM Consult  Post Acute Care Choice:    Choice offered to:  Patient  DME Arranged:    DME Agency:     HH Arranged:    HH Agency:  [employer]administrative [facility]  Status of Service:  Completed, signed off  Medicare Important Message Given:    Date Medicare IM Given:    Medicare IM give by:    Date Additional Medicare IM Given:    Additional Medicare Important Message give by:     If discussed at Long Length of Stay Meetings, dates discussed:    Additional Comments:  Durenda Guthrie, RN 02/16/2015, 12:43 PM

## 2016-09-08 IMAGING — CR DG CHEST 2V
2 series · 2 of 2 positions shown · non-contrast
Comparison: None.

CLINICAL DATA: Pre-admission study prior to right hip arthroplasty;
history of previous tobacco use, hypertension, and cardiac
dysrhythmia.

EXAM:
CHEST  2 VIEW

[w chest pa]
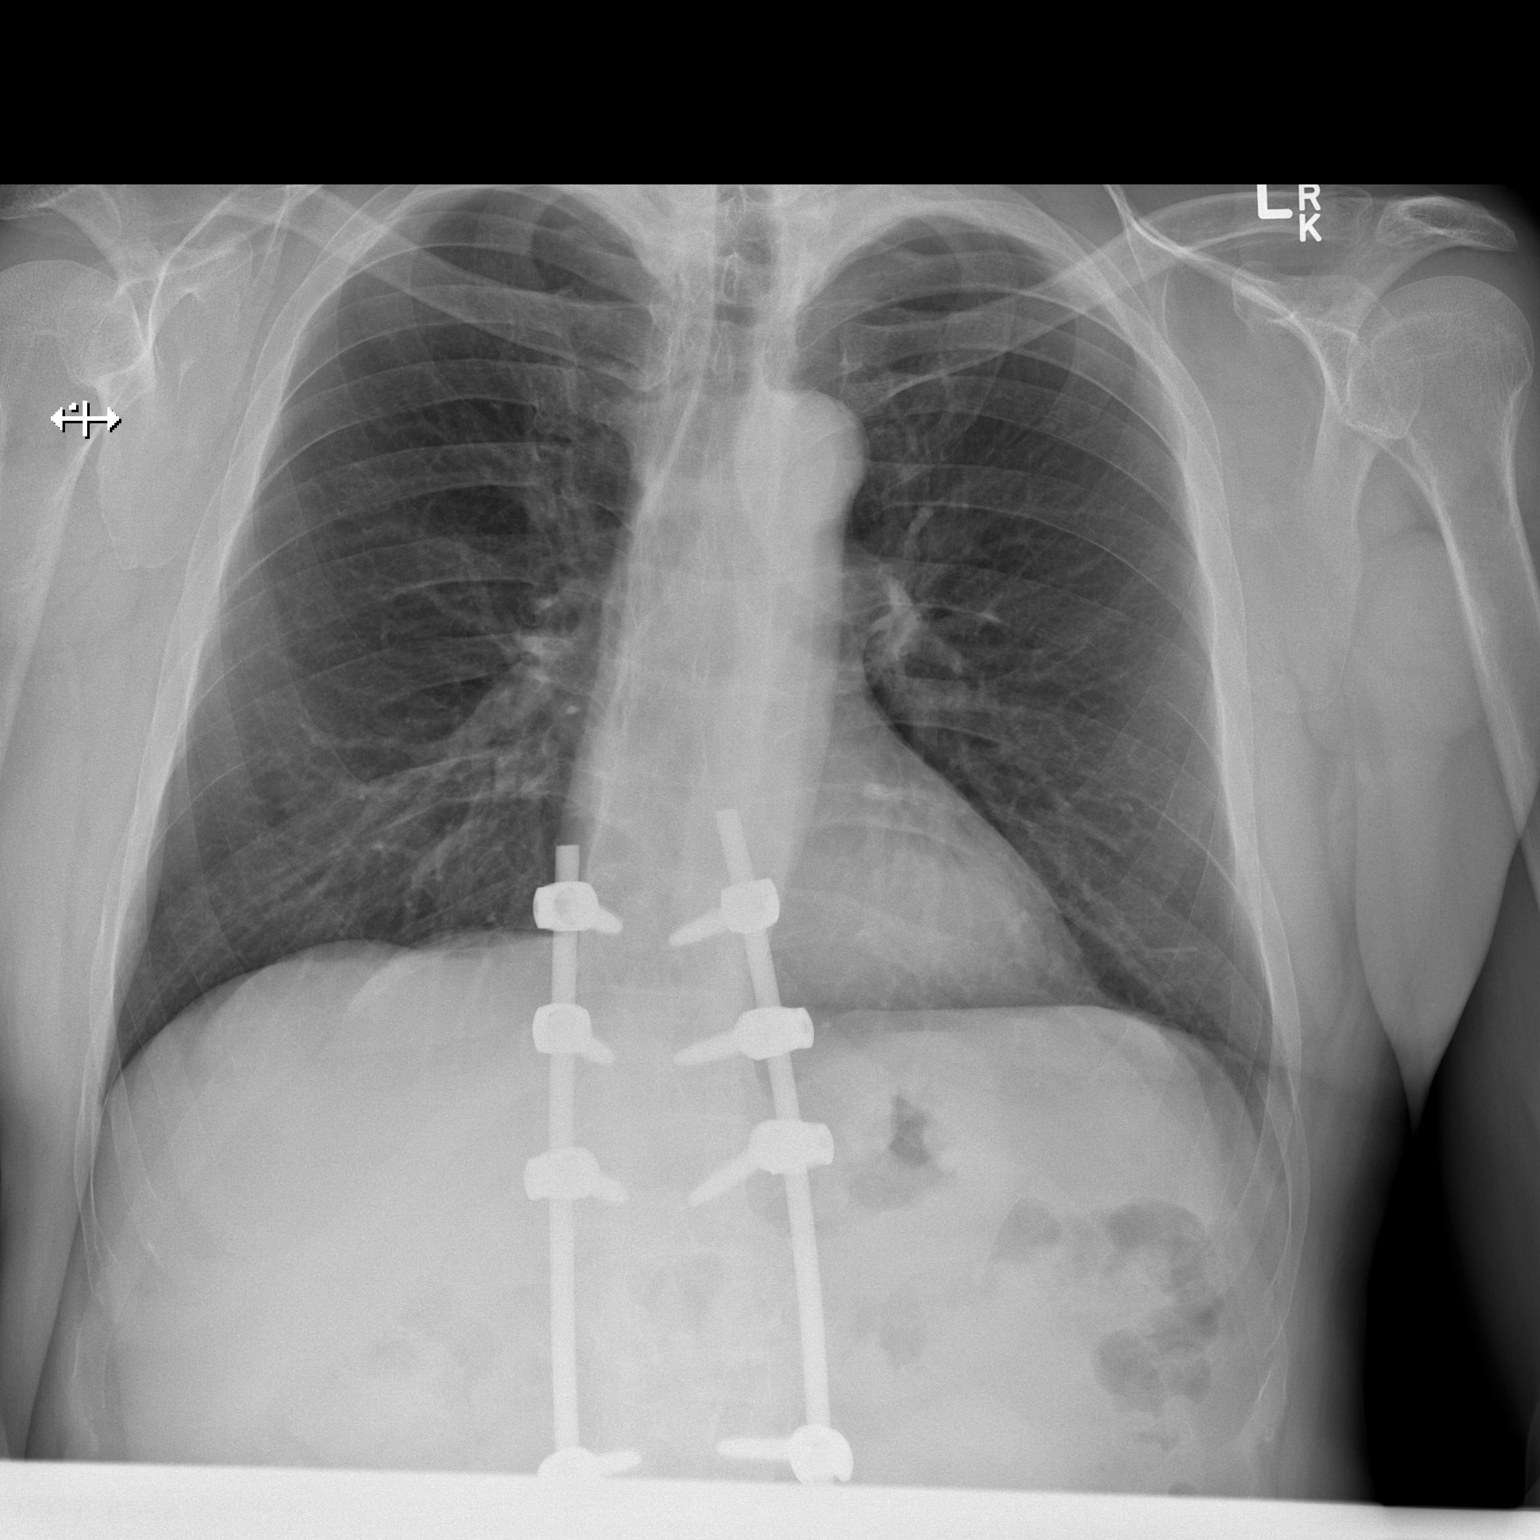

[w chest lat]
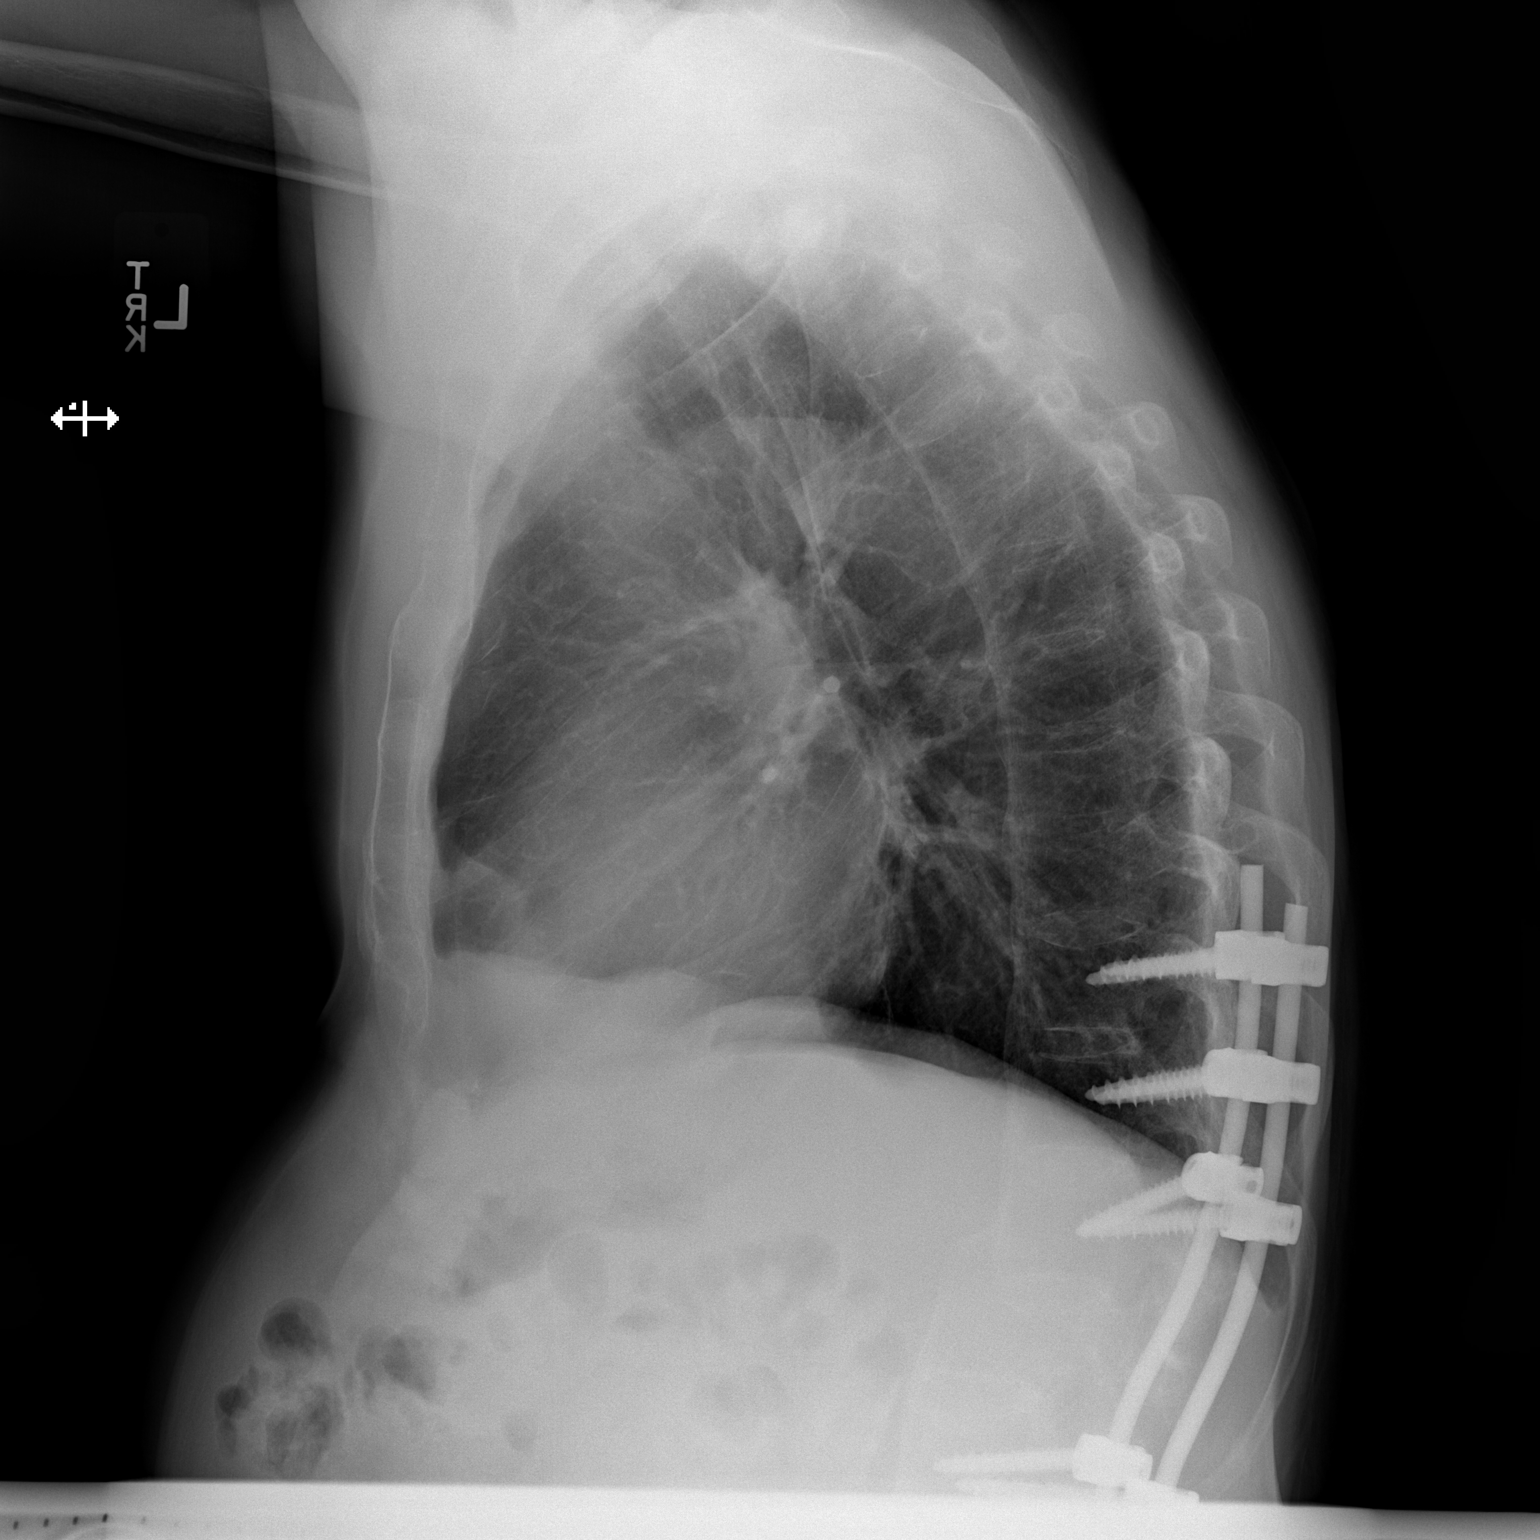

[2 of 2 positions shown; findings below may reference images not displayed]

FINDINGS: The lungs are adequately inflated and clear. The heart and pulmonary
vascularity are normal. The mediastinum is normal in width. There is
no pleural effusion. The patient has undergone an stabilization
procedure due to compression fracture at L2. There are pedicle
screws and connecting rods from T11 inferiorly
IMPRESSION: There is no active cardiopulmonary disease.

## 2016-09-19 IMAGING — CR DG HIP (WITH OR WITHOUT PELVIS) 1V PORT*R*
2 series · 2 of 2 positions shown · non-contrast
Comparison: None.

CLINICAL DATA: Right hip replacement

EXAM:
DG HIP (WITH OR WITHOUT PELVIS) 1V PORT RIGHT

[AP]
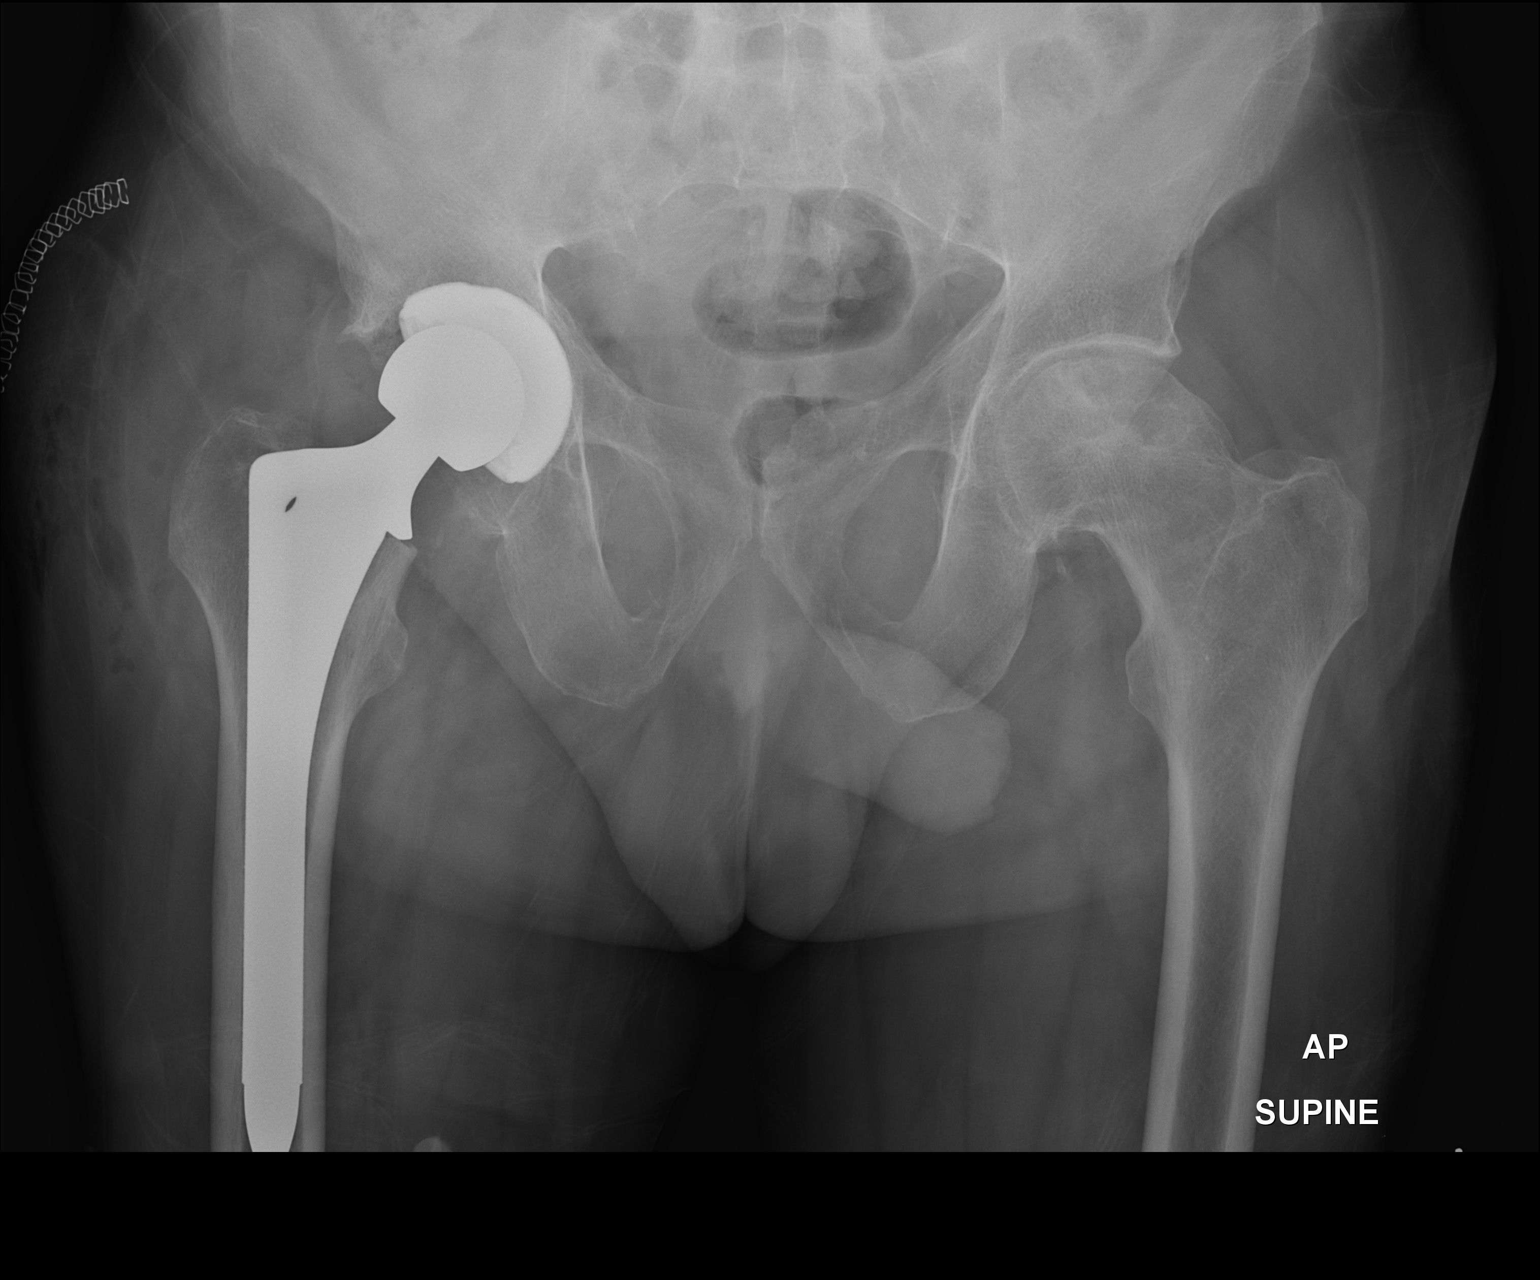

[xtable lateral]
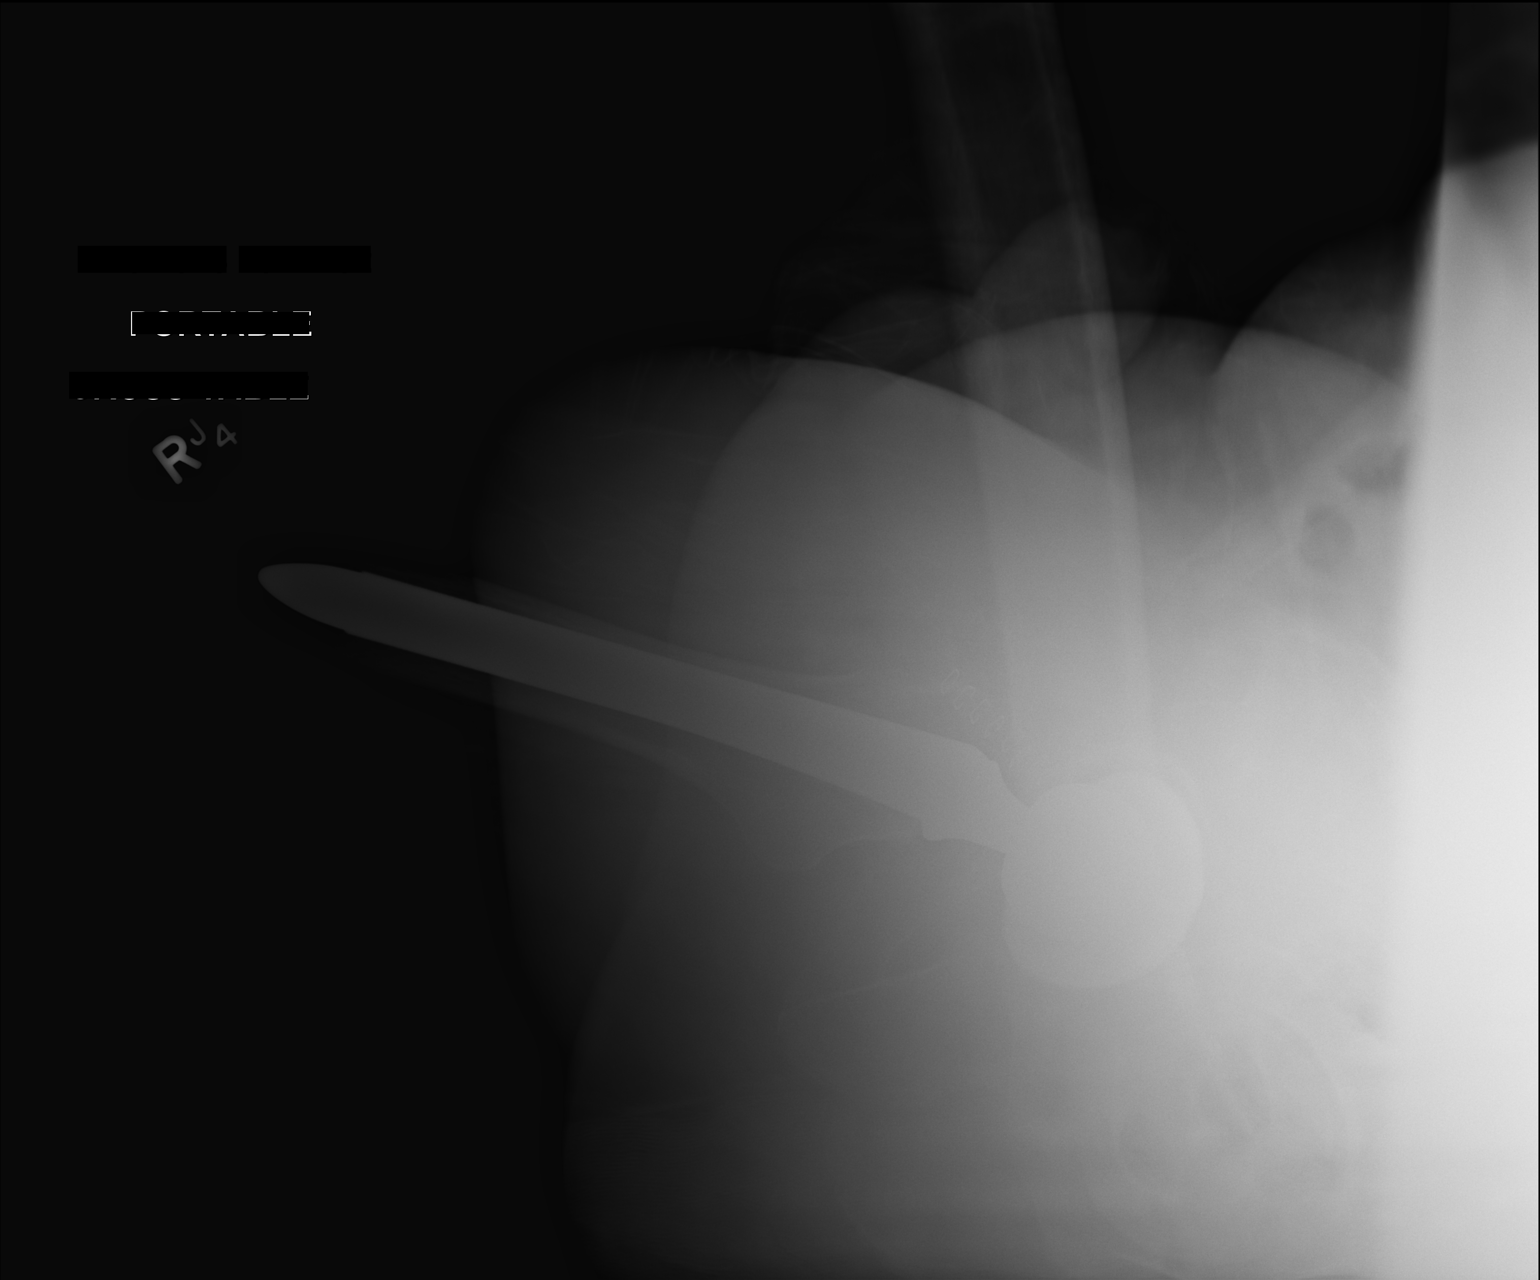

[2 of 2 positions shown; findings below may reference images not displayed]

FINDINGS: Lateral imaging is limited by over penetration, limiting evaluation
of the femoral stem.

Total right hip arthroplasty with no periprosthetic fracture or
dislocation.

Left hip osteoarthritis with large cyst in the subchondral femoral
head neck. The femoral head is aspherical, suspect prior avascular
necrosis.
IMPRESSION: 1. No adverse findings after total right hip arthroplasty.
2. Left hip osteoarthritis with large femoral head cyst, probable
remote avascular necrosis.
